# Patient Record
Sex: Female | Born: 1935 | Race: White | Hispanic: No | Marital: Married
Health system: Midwestern US, Academic
[De-identification: ages and names within clinical notes are randomized; demographics above are authoritative.]

---

## 2017-06-23 ENCOUNTER — Encounter: Admit: 2017-06-23 | Discharge: 2017-06-23 | Payer: MEDICARE

## 2017-06-23 ENCOUNTER — Inpatient Hospital Stay: Admit: 2017-06-23 | Discharge: 2017-06-23 | Payer: MEDICARE

## 2017-06-23 DIAGNOSIS — C189 Malignant neoplasm of colon, unspecified: ICD-10-CM

## 2017-06-23 DIAGNOSIS — G2581 Restless legs syndrome: Principal | ICD-10-CM

## 2017-06-23 DIAGNOSIS — A419 Sepsis, unspecified organism: Principal | ICD-10-CM

## 2017-06-23 DIAGNOSIS — H409 Unspecified glaucoma: ICD-10-CM

## 2017-06-23 DIAGNOSIS — Z8619 Personal history of other infectious and parasitic diseases: ICD-10-CM

## 2017-06-23 MED ORDER — ONDANSETRON HCL (PF) 4 MG/2 ML IJ SOLN
4 mg | INTRAVENOUS | 0 refills | Status: DC | PRN
Start: 2017-06-23 — End: 2017-06-26

## 2017-06-23 MED ORDER — VANCOMYCIN 1,250 MG IVPB
15 mg/kg | Freq: Once | INTRAVENOUS | 0 refills | Status: CP
Start: 2017-06-23 — End: ?
  Administered 2017-06-24 (×2): 1250 mg via INTRAVENOUS

## 2017-06-23 MED ORDER — VANCOMYCIN 1,250 MG IVPB
15 mg/kg | Freq: Two times a day (BID) | INTRAVENOUS | 0 refills | Status: DC
Start: 2017-06-23 — End: 2017-06-24

## 2017-06-23 MED ORDER — CEFEPIME IN DEXTROSE,ISO-OSM 2 GRAM/100 ML IV PGBK
2 g | Freq: Once | INTRAVENOUS | 0 refills | Status: CP
Start: 2017-06-23 — End: ?
  Administered 2017-06-24: 01:00:00 2 g via INTRAVENOUS

## 2017-06-23 MED ORDER — LACTATED RINGERS IV SOLP
1000 mL | Freq: Once | INTRAVENOUS | 0 refills | Status: CP
Start: 2017-06-23 — End: ?
  Administered 2017-06-24: 06:00:00 1000 mL via INTRAVENOUS

## 2017-06-23 MED ORDER — ACETAMINOPHEN 500 MG PO TAB
1000 mg | Freq: Once | ORAL | 0 refills | Status: CP
Start: 2017-06-23 — End: ?
  Administered 2017-06-24: 03:00:00 1000 mg via ORAL

## 2017-06-23 MED ORDER — HELP MEDICATION
Freq: Every evening | 0 refills | Status: DC
Start: 2017-06-23 — End: 2017-06-24

## 2017-06-23 MED ORDER — ENOXAPARIN 40 MG/0.4 ML SC SYRG
40 mg | Freq: Every day | SUBCUTANEOUS | 0 refills | Status: DC
Start: 2017-06-23 — End: 2017-06-26
  Administered 2017-06-25 – 2017-06-26 (×2): 40 mg via SUBCUTANEOUS

## 2017-06-23 MED ORDER — LACTATED RINGERS IV SOLP
30 mL/kg | INTRAVENOUS | 0 refills | Status: CP
Start: 2017-06-23 — End: ?
  Administered 2017-06-24: 01:00:00 2448 mL via INTRAVENOUS

## 2017-06-23 MED ORDER — ACETAMINOPHEN 325 MG PO TAB
650 mg | ORAL | 0 refills | Status: DC | PRN
Start: 2017-06-23 — End: 2017-06-26
  Administered 2017-06-24: 14:00:00 650 mg via ORAL

## 2017-06-23 MED ORDER — VANCOMYCIN PHARMACY TO MANAGE
1 | 0 refills | Status: DC
Start: 2017-06-23 — End: 2017-06-24

## 2017-06-23 MED ORDER — AZITHROMYCIN 250 MG PO TAB
500 mg | Freq: Every day | ORAL | 0 refills | Status: DC
Start: 2017-06-23 — End: 2017-06-24
  Administered 2017-06-24: 04:00:00 500 mg via ORAL

## 2017-06-23 MED ORDER — CEFEPIME IN DEXTROSE,ISO-OSM 2 GRAM/100 ML IV PGBK
2 g | INTRAVENOUS | 0 refills | Status: DC
Start: 2017-06-23 — End: 2017-06-24
  Administered 2017-06-24 (×2): 2 g via INTRAVENOUS

## 2017-06-24 ENCOUNTER — Encounter: Admit: 2017-06-24 | Discharge: 2017-06-24 | Payer: MEDICARE

## 2017-06-24 ENCOUNTER — Inpatient Hospital Stay: Admit: 2017-06-24 | Discharge: 2017-06-24 | Payer: MEDICARE

## 2017-06-24 LAB — LACTIC ACID (BG - RAPID LACTATE)
Lab: 3.8 MMOL/L — ABNORMAL HIGH (ref 0.5–2.0)
Lab: 2.7 MMOL/L — ABNORMAL HIGH (ref 0.5–2.0)

## 2017-06-24 LAB — BASIC METABOLIC PANEL
Lab: 103 MMOL/L — ABNORMAL HIGH (ref 98–110)
Lab: 112 mg/dL — ABNORMAL HIGH (ref 70–100)
Lab: 134 MMOL/L — ABNORMAL LOW (ref 137–147)
Lab: 22 MMOL/L (ref 21–30)
Lab: 3.8 MMOL/L — ABNORMAL HIGH (ref 3.5–5.1)
Lab: 9 pg (ref 3–12)

## 2017-06-24 LAB — RVP VIRAL PANEL PCR

## 2017-06-24 LAB — PROCALCITONIN: Lab: 0.3 ng/mL — ABNORMAL HIGH (ref <0.10)

## 2017-06-24 LAB — LACTIC ACID(LACTATE): Lab: 2 MMOL/L (ref 0.5–2.0)

## 2017-06-24 LAB — LEGIONELLA ANTIGEN URINE,RAN: Lab: NEGATIVE

## 2017-06-24 LAB — C DIFFICILE BY PCR: Lab: POSITIVE

## 2017-06-24 LAB — COMPREHENSIVE METABOLIC PANEL: Lab: 132 MMOL/L — ABNORMAL LOW (ref 137–147)

## 2017-06-24 LAB — CBC AND DIFF
Lab: 18 10*3/uL — ABNORMAL HIGH (ref 4.5–11.0)
Lab: 18 10*3/uL — ABNORMAL HIGH (ref 4.5–11.0)

## 2017-06-24 LAB — BLOOD GASES, ARTERIAL
Lab: 0.8 MMOL/L — ABNORMAL LOW (ref 25–110)
Lab: 23 MMOL/L — AB (ref 21–28)
Lab: 31 mmHg — ABNORMAL LOW (ref 35–45)
Lab: 7.4 g/dL — ABNORMAL HIGH (ref 7.35–7.45)
Lab: 73 mmHg — ABNORMAL LOW (ref 80–100)
Lab: 95 % (ref 95–99)

## 2017-06-24 LAB — POC GLUCOSE: Lab: 139 mg/dL — ABNORMAL HIGH (ref 70–100)

## 2017-06-24 LAB — POC LACTATE
Lab: 3.7 MMOL/L — ABNORMAL HIGH (ref 0.5–2.0)
Lab: 3.9 MMOL/L — ABNORMAL HIGH (ref 0.5–2.0)

## 2017-06-24 LAB — POC TROPONIN: Lab: 0 ng/mL (ref 0.00–0.05)

## 2017-06-24 LAB — PTT (APTT): Lab: 26 s (ref 20.0–36.0)

## 2017-06-24 LAB — SED RATE: Lab: 18 mm/h (ref 0–30)

## 2017-06-24 LAB — STREPTOCOCCUS PNEUMO AG, URINE

## 2017-06-24 LAB — C REACTIVE PROTEIN (CRP): Lab: 11 mg/dL — ABNORMAL HIGH (ref <1.0)

## 2017-06-24 LAB — URINALYSIS DIPSTICK: Lab: NEGATIVE U/L (ref 7–56)

## 2017-06-24 LAB — D-DIMER: Lab: 138 ng{FEU}/mL — ABNORMAL HIGH (ref <500)

## 2017-06-24 LAB — URINALYSIS, MICROSCOPIC

## 2017-06-24 LAB — CREATINE KINASE-CPK: Lab: 42 U/L (ref 21–215)

## 2017-06-24 LAB — PROTIME INR (PT): Lab: 1.2 mg/dL (ref 0.8–1.2)

## 2017-06-24 MED ORDER — VANCOMYCIN 1G/250ML D5W IVPB (VIAL2BAG)
1 g | INTRAVENOUS | 0 refills | Status: DC
Start: 2017-06-24 — End: 2017-06-24

## 2017-06-24 MED ORDER — LATANOPROST 0.005 % OP DROP
1 [drp] | Freq: Every evening | OPHTHALMIC | 0 refills | Status: DC
Start: 2017-06-24 — End: 2017-06-26
  Administered 2017-06-25: 02:00:00 1 [drp] via OPHTHALMIC

## 2017-06-24 MED ORDER — VANCOMYCIN PHARMACY TO MANAGE
1 | 0 refills | Status: DC
Start: 2017-06-24 — End: 2017-06-24

## 2017-06-24 MED ORDER — LACTOBACILLUS RHAMNOSUS GG 15 BILLION CELL PO CPSP
1 | Freq: Two times a day (BID) | ORAL | 0 refills | Status: DC
Start: 2017-06-24 — End: 2017-06-26
  Administered 2017-06-24 – 2017-06-26 (×5): 1 via ORAL

## 2017-06-24 MED ORDER — PERFLUTREN LIPID MICROSPHERES 1.1 MG/ML IV SUSP
1-20 mL | Freq: Once | INTRAVENOUS | 0 refills | Status: CP
Start: 2017-06-24 — End: ?
  Administered 2017-06-24: 16:00:00 2 mL via INTRAVENOUS

## 2017-06-24 MED ORDER — VANCOMYCIN 25 MG/ML PO SOLR
250 mg | Freq: Four times a day (QID) | ORAL | 0 refills | Status: DC
Start: 2017-06-24 — End: 2017-06-26
  Administered 2017-06-24 – 2017-06-26 (×9): 250 mg via ORAL

## 2017-06-24 MED ORDER — CEFEPIME IN DEXTROSE,ISO-OSM 2 GRAM/100 ML IV PGBK
2 g | Freq: Two times a day (BID) | INTRAVENOUS | 0 refills | Status: DC
Start: 2017-06-24 — End: 2017-06-24

## 2017-06-24 NOTE — Progress Notes
NICOM Fluid Bolus 58mL  Fluid Bolus Challenge: yes  Fluid Volume infused: 526ml Time started: 1941   CO CI HR NIBP MAP TPR TPRI SV SVI SVV   Baseline  4.2 120      36    Challenge  4.9 117      44    Stroke Volume Index Change: 22.8%

## 2017-06-24 NOTE — Care Coordination-Inpatient
Please page [phone] with any issues until 8am after that please page med private N

## 2017-06-24 NOTE — ED Notes
[place] (ready) please call Raquel Sarna at [phone]

## 2017-06-24 NOTE — Other
Critical result or procedure called (document test and value, and read back):  Lactate 3.86  Time MD/NP Notified:  [email or web address]  MD/NP Name:  Dr. Sheran Spine, Dr. Hampton Abbot   MD/NP Response/Orders Given:  Activating sepsis response team

## 2017-06-24 NOTE — Progress Notes
RESPIRATORY THERAPY  ADULT PROTOCOL EVALUATION      RESPIRATORY PROTOCOL PLAN    Medications       Note: If indicated by protocol, medication orders will be placed by therapist.    Procedures  Oxygen/Humidity: O2 to keep SpO2 > 92%  Monitoring: Pulse oximetry BID & PRN       _____________________________________________________________    PATIENT EVALUATION RESULTS    Chart Review  * Pulmonary Hx: No pulmonary diagnosis OR no smoking hx    * Surgical Hx: No surgery OR last surgery > 6 weeks ago OR trach/stoma (BA)    * Chest X-Ray: Clear OR not available (CXR Still in Process)    * PFT/Oxygenation: FEV1, PEFR < 70% OR Pa02 < 70 RA OR Sp02 <92% RA OR Fi02 > 0.21 to keep Sp02 > 92% OR < 24 hours post-op (02 & oxim) OR chronic C02 retention (C02) (94% on RA)      Patient Assessment  * Respiratory Pattern: Regular pattern and rate OR good chest excursion with deep breathing    * Breath Sounds: Clear and able to auscultate bases posteriorly    * Cough / Sputum: Good effective cough OR occasional or minimal sputum OR thin sputum    * Mental Status: Alert, oriented, cooperative    * Activity Level: Ambulatory with assistance      Priority Index  Total Points: 4 Points  * Priority Index: 1    PRIORITY INDEX GUIDELINES*  Priority Points   1 0-9 points   2 9-18 points   3 > 18 points   + Pulm Dx or Home Rx   *Higher points indicate higher acuity.      Therapist: Jasper Loser, RT  Date: 06/24/2017      Key  AC = Airway clearance  AM = Aerosolized medication  BA = Bland aerosol  DB&C = Deep breathe & cough  FEV1 = Forced expiratory volume in first second)  IC = Inspiratory capacity  LE = Lung expansion  MDI = Metered dose inhaler  Neb = Nebulizer  O2 = Oxygen  Oxim =Oximetry  PEFR = Peak expiratory flow rate  RRT = Rapid Response Team

## 2017-06-24 NOTE — Progress Notes
NICOM Fluid Bolus   Fluid Bolus Challenge:yes  Fluid Volume infused: 568ml Time started: 2025   CO CI HR NIBP MAP TPR TPRI SV SVI SVV   Baseline  4.2       35    Challenge  4.5       39    Stroke Volume Index Change: 10.1%

## 2017-06-24 NOTE — Progress Notes
Patient arrived to room 6420 via cart accompanied by tech. Patient transferred to the bed with assistance. Bedside safety checks completed. Initial patient assessment completed, refer to flowsheet for details. Admission skin assessment completed by: Bonney Roussel, RN & Joycelyn Schmid, RN    Pressure Injury Present on Hospital Admission (within 24 hours): No    1. Occiput: No  2. Ear: No  3. Scapula: No  4. Spinous Process: No  5. Shoulder: No  6. Elbow: No  7. Iliac Crest: No  8. Sacrum/Coccyx: No  9. Ischial Tuberosity: No  10. Trochanter: No  11. Knee: No  12. Malleolus: No  13. Heel: No  14. Toes: No  15. Assessed for device associated injury Yes  16. Nursing Nutrition Assessment Completed Yes

## 2017-06-24 NOTE — Progress Notes
NICOM Fluid Bolus   Fluid Bolus Challenge: yes  Fluid Volume infused: 575ml Time started: 2010   CO CI HR NIBP MAP TPR TPRI SV SVI SVV   Baseline  4.3       38    Challenge  5.1       43    Stroke Volume Index Change: 14.3%

## 2017-06-24 NOTE — ED Notes
Sepsis response team activated at this time

## 2017-06-25 LAB — CBC AND DIFF: Lab: 13 K/UL — ABNORMAL HIGH (ref 4.5–11.0)

## 2017-06-25 LAB — BASIC METABOLIC PANEL
Lab: 129 MMOL/L — ABNORMAL LOW (ref 137–147)
Lab: 21 MMOL/L — ABNORMAL LOW (ref 21–30)
Lab: 3.6 MMOL/L — ABNORMAL HIGH (ref 3.5–5.1)

## 2017-06-25 NOTE — Progress Notes
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  Additionally, mild endometrial thickening  -3 prior episodes of CDI from February to March 2018 after exposure to IV antibiotic, believed that all episodes treated with PO vancomycin, 3rd episode with prolonged taper.  Never required hospitalization      Recent uterine polyp removal  -Procedure on 9/5, reports some uterine bleeding afterwards but no foul odor, no further discharge, no pelvic pain    L shoulder arthroplasty, complicated by post-op hematoma and infection  -Arthoplasty in 09/2016, developed hematoma in 10/2016 and went back to the OR for evacuation  -Unclear, but per son received 6 weeks of IV antibiotics via PICC line after the hematoma evacuation, unknown abx but received Q24H, managed by Dr. Tawanna Cooler    Central sleep apnea  RLS  -On ropinirole PTA      Recommendations:     C diff positive, other abx stopped   when diarrhea is better, then can switch to po vancomycin 125 QID for 14 days, then recommend po vanc 125 mg daily for chronic suppression/prevention vue the age o patient and the recurrent c diff    History of Present Illness    NELIAH Diaz is a 83 y.o. female     Diarrhea is slightly better   no more fever     no N/V no abd pain   no cough no sob   feels ok otherwise     labs     wbc better at 13  c diff positive Antimicrobial Start date End date   Vancomycin 9/7 9/8   Cefepime 9/7 9/8   Azithromycin 9/7 9/8   PO vancomycin 9/8 active                  CrCl cannot be calculated (Unknown ideal weight.).      Medications   Scheduled Meds:    enoxaparin (LOVENOX) syringe 40 mg 40 mg Subcutaneous QDAY(21)   lactobacillus rhamnosus GG (CULTURELLE) 15 billion cell capsule 1 capsule 1 capsule Oral BID w/meals   latanoprost (XALATAN) 0.005 % ophthalmic solution 1 drop 1 drop Right Eye QHS   vancomycin (FIRVANQ) oral solution 250 mg 250 mg Oral QID   Continuous Infusions:  PRN and Respiratory Meds:acetaminophen Q6H PRN, ondansetron (ZOFRAN) IV Q6H PRN      Physical Examination                          Vital Signs: Last                  Vital Signs: 24 Hour Range   BP: 117/66 (09/09 0843)  Temp: 36.9 ???C (98.4 ???F) (09/09 4540)  Pulse: 69 (09/09 0843)  Respirations: 17 PER MINUTE (09/09 0843)  SpO2: 94 % (09/09 0843)  O2 Delivery: None (Room Air) (  09/09 0843)  Height: 147.3 cm (58) (09/08 1102) BP: (117-150)/(52-74)   Temp:  [36.9 ???C (98.4 ???F)-37.7 ???C (99.9 ???F)]   Pulse:  [69-115]   Respirations:  [17 PER MINUTE-38 PER MINUTE]   SpO2:  [93 %-99 %]   O2 Delivery: None (Room Air)     General appearance: oriented, elderly, NAD  HENT: mucus membranes moist, no oral lesions/thrush  Lungs: CTAB, no wheezing, rhonchi, or rales appreciated  Heart: Tachycardic, regular rhythm, no murmur  Abdomen: soft, non-distended, non-tender, hyperactive bowel sounds, no HSM.  Prior LLQ surgical scar, well healed  Ext:  No clubbing, cyanosis, 1+ bilateral LE edema.  L shoulder well-healed surgical scar, no joint effusion or tenderness  Skin: no rashes/lesions        Lab Review   Hematology  Recent Labs      06/23/17   1909  06/24/17   0010  06/24/17   0404  06/25/17   0553   WBC  18.1*   --   18.6*  13.7*   HGB  16.0*   --   15.4*  14.8   HCT  49.0*   --   46.5*  43.5   PLTCT  293   --   233  209   PTT   --   26.3   --    --    INR   --   1.2   --    -- Chemistry  Recent Labs      06/23/17   1909  06/24/17   0404  06/25/17   0553   NA  132*  134*  129*   K  4.0  3.8  3.6   CL  98  103  101   CO2  22  22  21    BUN  19  17  15    CR  0.93  0.83  0.84   GFR  57*  >60  >60   GLU  157*  112*  117*   CA  9.7  8.7  8.4*   ALBUMIN  4.5   --    --    ALKPHOS  58   --    --    AST  24   --    --    ALT  16   --    --    TOTBILI  0.6   --    --        Microbiology, Radiology and other Diagnostics Review   Microbiology data reviewed.  9/7 BC x2 - NGTD  9/7 Urine Strep pneumo and Legionella Ag - negative    Pertinent radiology images viewed.  9/7 CT chest:  1. Mild bibasilar atelectasis and scarring, without focal pneumonia.  2. Multivessel coronary artery calcifications.  3. Suspected mild gallbladder sludge with no evidence of acute   Cholecystitis.    9/8 CT abd/pelvis:  1. Mild pericolonic stranding involving the ascending, transverse, and   descending colon (greatest around the ascending colon) compatible with   colitis, likely on an infectious or inflammatory basis.  2. No ascites, bowel obstruction, or free air.  3. Suggestion of mild endometrial thickening, incompletely evaluated on   this exam. Correlation with nonemergent pelvic ultrasound recommended for   further characterization.

## 2017-06-25 NOTE — Progress Notes
PHYSICAL THERAPY  ASSESSMENT/DISCHARGE NOTE     MOBILITY:  Mobility  Progressive Mobility Level: Walk laps  Distance Walked (feet): 300 ft  Level of Assistance: Stand by assistance  Assistive Device: Walker  Time Tolerated: 11-30 minutes  Activity Limited By: Shortness of air    SUBJECTIVE:  Subjective  Significant hospital events: PMHx of RLS, recent L shoulder replacement comp w/ post-operative infection and c difficile, colon cancer admitted for severe sepsis   Mental / Cognitive Status: Alert;Oriented;Cooperative;Follows Commands  Pain: Patient has no complaint of pain  Ambulation Assist: Independent Mobility in [employer] with Device  Patient Owned Equipment: Single [employer] Situation: Lives with Family (Spouse who is currenlty in SICU)  Type of Home: House  Entry Stairs: No Stairs  In-Home Stairs: 6-10 Stairs (To basement; (doesn't need to go down to))    ROM:  ROM  ROM Method: Active  LE ROM: Bilateral;Hip;Knee;Ankle;WFL    STRENGTH:  Strength  Overall Strength: WFL;No Focal Deficits Noted    BED MOBILITY/TRANSFERS:  Bed Mobility/Transfers  Transfer Type: Sit to Stand  Transfer: Assistance Level: From;Bed Side Chair;Standby Assist  Transfer: Assistive Device: Nurse, adult  Transfers: Type Of Assistance: Verbal Cues  End Of Activity Status: Up in Chair;Nursing Notified;Instructed Patient to Request Assist with Mobility;Instructed Patient to Use Call Light    BALANCE:  Balance  Standing Balance: Static Standing Balance;Dynamic Standing Balance;No UE support;Standby Assist    GAIT:  Gait  Gait Distance: 300 feet  Gait: Assistance Level: Standby Assist  Gait: Assistive Device:  (150' with RW; 150' without)  Gait: Descriptors: Pace: Normal;Swing-Through Gait;No balance loss  Activity Limited By: SOA    EDUCATION:  Education  Persons Educated: Patient  Patient Barriers To Learning: None Noted  Teaching Methods: Verbal Instruction  Patient Response: Verbalized Understanding;Return Demonstration Topics: Plan/Goals of PT Interventions;Importance of Increasing Activity;Ambulate With Nursing;Up with Assist Only    ASSESSMENT/PROGRESS:  Assessment/Progress  Assessment/Progress: Discontinue PT;Patient current status and level of safety suggests progression of activity can be achieved with nursing and/or family and does not require physical therapist intervention    AM-PAC 6 Clicks Basic Mobility Inpatient  Turning from your back to your side while in a flat bed without using bed rails: None  Moving from lying on your back to sitting on the side of a flatbed without using bedrails : None  Moving to and from a bed to a chair (including a wheelchair): None  Standing up from a chair using your arms (e.g. wheelchair, or bedside chair): None  To walk in hospital room: None  Climbing 3-5 steps with a railing: A Little  Raw Score: 23  Standardized (T-scale) Score: 50.88  Basic Mobility CMS 0-100%: 16.55  CMS G Code Modifier for Basic Mobility: CI     G-Codes: Mobility  [id number] Current Status: 1-19% Impairment   G8979 Goal Status:1-19% Impairment    G8980 Discharge Status: 1-19% Impairment      Based on above evaluation and clinical judgment.    PLAN:  Plan   Plan Frequency: No Further Treatment    RECOMMENDATIONS:  PT Discharge Recommendations  PT Discharge Recommendations: Home with Assistance  Equipment Recommendations: None;Patient owns necessary equipment    Therapist: Ernestine Mcmurray, PT  Date: 06/25/2017

## 2017-06-26 ENCOUNTER — Encounter: Admit: 2017-06-26 | Discharge: 2017-06-26 | Payer: MEDICARE

## 2017-06-26 ENCOUNTER — Inpatient Hospital Stay: Admit: 2017-06-23 | Discharge: 2017-06-26 | Disposition: A | Discharge status: Home / Self Care | Payer: MEDICARE

## 2017-06-26 ENCOUNTER — Emergency Department: Admit: 2017-06-23 | Discharge: 2017-06-23 | Payer: MEDICARE

## 2017-06-26 DIAGNOSIS — G4733 Obstructive sleep apnea (adult) (pediatric): ICD-10-CM

## 2017-06-26 DIAGNOSIS — Z9081 Acquired absence of spleen: ICD-10-CM

## 2017-06-26 DIAGNOSIS — Z96612 Presence of left artificial shoulder joint: ICD-10-CM

## 2017-06-26 DIAGNOSIS — R6520 Severe sepsis without septic shock: ICD-10-CM

## 2017-06-26 DIAGNOSIS — G2581 Restless legs syndrome: ICD-10-CM

## 2017-06-26 DIAGNOSIS — H409 Unspecified glaucoma: ICD-10-CM

## 2017-06-26 DIAGNOSIS — A0472 Enterocolitis due to Clostridium difficile, not specified as recurrent: ICD-10-CM

## 2017-06-26 DIAGNOSIS — E872 Acidosis: ICD-10-CM

## 2017-06-26 DIAGNOSIS — Z85038 Personal history of other malignant neoplasm of large intestine: ICD-10-CM

## 2017-06-26 DIAGNOSIS — A419 Sepsis, unspecified organism: Principal | ICD-10-CM

## 2017-06-26 LAB — BASIC METABOLIC PANEL: Lab: 135 MMOL/L — ABNORMAL LOW (ref >60)

## 2017-06-26 LAB — CBC AND DIFF: Lab: 7.5 10*3/uL — ABNORMAL HIGH (ref >60)

## 2017-06-26 MED ORDER — VANCOMYCIN 25 MG/ML PO SOLR
125 mg | Freq: Four times a day (QID) | ORAL | 0 refills | Status: DC
Start: 2017-06-26 — End: 2017-06-26
  Administered 2017-06-26: 19:00:00 125 mg via ORAL

## 2017-06-26 MED ORDER — VANCOMYCIN 25 MG/ML PO SOLR
Freq: Two times a day (BID) | 0 refills | Status: SS
Start: 2017-06-26 — End: 2017-08-23

## 2017-06-26 MED ORDER — LACTOBACILLUS RHAMNOSUS GG 15 BILLION CELL PO CPSP
1 | ORAL_CAPSULE | Freq: Two times a day (BID) | ORAL | 3 refills | Status: SS
Start: 2017-06-26 — End: 2017-08-23

## 2017-06-26 MED ORDER — VANCOMYCIN 125 MG PO CAP
ORAL_CAPSULE | Freq: Four times a day (QID) | ORAL | 11 refills | 10.00000 days | Status: AC
Start: 2017-06-26 — End: 2017-06-26

## 2017-06-26 MED ORDER — POTASSIUM CHLORIDE 20 MEQ PO TBTQ
40 meq | Freq: Once | ORAL | 0 refills | Status: CP
Start: 2017-06-26 — End: ?
  Administered 2017-06-26: 16:00:00 40 meq via ORAL

## 2017-06-26 NOTE — Case Management (ED)
Case Management Admission Assessment    NAME:Tara Diaz                          MRN: [id number]             DOB:1930/10/03          AGE: 83 y.o.  ADMISSION DATE: 06/23/2017             DAYS ADMITTED: LOS: 3 days      Today???s Date: 06/26/2017    Source of Information: Patient and Son       Plan: Home today  Plan: Discharge Planning for Home Anticipated   ??? Pt needs vancomycin oral solution 125 qid for a total of 14 days (until 07/05/17) then twce daily x 1 week then daily for 1 week, then every other day x 4 weeks.    ??? Pt does not have any prescription insurance.  ??? Pt's son will drive her home.    Interventions  ? Support      ? Info or Referral      ? Discharge Planning      ? Medication Needs   Medication Needs: Co-Pay Check (for oral vancomycin)   ??? NCM called a script to pt[facility] home [facility].  Vancomycin capsules are $621.32/month.  ??? NCM spoke with patient and son at bedside.  Pt has been on vancomycin oral solution in the past and it was cheaper than the capsules. [facility] will have to order it in.  Pt's son was requesting a taper on the medication, which was approved by ID. Team Pharmacist, Shanda Bumps, [facility].  [facility] to take until the oral solution arrives tomorrow.    ? Financial      ? Legal      ? Other      Disposition  ? Expected Discharge Date    Expected Discharge Date: 06/26/17  ? Transportation   Does the patient need discharge transport arranged?: No  Transportation Name, Phone and Availability #1: son at bedside and will drive her home today  Does the patient use Medicaid Transportation?: No  ? Next Level of Care (Acute Psych discharges only)      ? Discharge Disposition      Durable Medical Equipment     No service has been selected for the patient.      [place] Destination     No service has been selected for the patient.      [facility]     No service has been selected for the patient.      [facility] No service has been selected for the patient.        Patient Address/Phone  [address]  [place] [address]  [phone] (home)     Emergency Contact  Extended Emergency Contact Information  Primary Emergency Contact: Cai, [place]  Home Phone: [phone]  Mobile Phone: [phone]  Relation: Son    Healthcare Directive  No    Transportation  Does the patient need discharge transport arranged?: No  Transportation Name, Phone and Availability #1: son at bedside and will drive her home today  Does the patient use Medicaid Transportation?: No    Expected Discharge Date  Expected Discharge Date: 06/26/17    Living Situation Prior to Admission  ? Living Arrangements  Type of Residence: Home, independent  Living Arrangements: Spouse/significant  other  ? Level of Function   Prior level of function: Independent (drives, is not homebound)  ? Cognitive Abilities   Cognitive Abilities: Alert and Oriented, Engages in problem solving and planning, Understands nature of health condition, Participates in decision making    Financial Resources  ? Coverage  Primary Insurance: Medicare Replacement [employer])  Secondary Insurance: No insurance  Additional Coverage:  (No prescription insurance.)    ? Source of Income   Source Of Income: SSI, Other retirement income  ? Financial Assistance Needed?  No  Psychosocial Needs  ? Mental Health  Mental Health History: No  ? Substance Use History  Substance Use History Screen: No  ? Other  no  Current/Previous Services  ? PCP  Nicole Cella, [phone], [phone]  ? Pharmacy    [phone] [place] [place] [facility] [address]  [address] [place]  [place] [address]  Phone: [phone] Fax: [phone]    [facility])  [phone] [address]  [place] [address]  Phone: [phone] Fax: [phone]    ? Durable Medical Equipment   Durable Medical Equipment at home: None  ? Home Health Receiving home health: In the past  Agency name: [facility]  Would patient use this agency again?: Yes  ? Hemodialysis or Peritoneal Dialysis  Undergoing hemodialysis or peritoneal dialysis: No  ? Tube/Enteral Feeds  Receive tube/enteral feeds: No  ? Infusion  Receive infusions: In the past  Where: Home  Infusion company: Unknown  ? Private Duty  Private duty help used: No  ? Home and Community Based [employer] and community based services: No  ? [facility]: N/A  ? Hospice  Hospice: No  ? Outpatient Therapy  PT: Yes  When did patient receive care?: after shoulder surgery  ? Skilled Nursing Facility/Nursing Home  SNF: No  NH: No  ? Inpatient Rehab  IPR: No  ? Long-Term Acute Care Hospital  LTACH: No  ? Acute Hospital Stay  Acute Hospital Stay: No    Sanjuan Dame, RN, ACM  Integrated Nurse Case Manager  [phone]  Pgr [phone]

## 2017-06-26 NOTE — Progress Notes
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  Additionally, mild endometrial thickening  -3 prior episodes of CDI from February to March 2018 after exposure to IV antibiotic, believed that all episodes treated with PO vancomycin, 3rd episode with prolonged taper.  Never required hospitalization      Recent uterine polyp removal  -Procedure on 9/5, reports some uterine bleeding afterwards but no foul odor, no further discharge, no pelvic pain    L shoulder arthroplasty, complicated by post-op hematoma and infection  -Arthoplasty in 09/2016, developed hematoma in 10/2016 and went back to the OR for evacuation  -Unclear, but per son received 6 weeks of IV antibiotics via PICC line after the hematoma evacuation, unknown abx but received Q24H, managed by Dr. Tawanna Cooler    Central sleep apnea  RLS  -On ropinirole PTA      Recommendations:     C diff positive, other abx stopped   Can switch to po vancomycin 125 QID for 14 days, then recommend po vanc 125 mg daily for chronic suppression/prevention vue the age  and the recurrent c diff    History of Present Illness    Tara Diaz is a 83 y.o. female     Diarrhea is better ++, only small BM last night   no  fever     no N/V no abd pain   no cough no sob   feels ok otherwise     labs     wbc normal  c diff positive          Antimicrobial Start date End date Vancomycin 9/7 9/8   Cefepime 9/7 9/8   Azithromycin 9/7 9/8   PO vancomycin 9/8 active                  CrCl cannot be calculated (Unknown ideal weight.).      Medications   Scheduled Meds:    enoxaparin (LOVENOX) syringe 40 mg 40 mg Subcutaneous QDAY(21)   lactobacillus rhamnosus GG (CULTURELLE) 15 billion cell capsule 1 capsule 1 capsule Oral BID w/meals   latanoprost (XALATAN) 0.005 % ophthalmic solution 1 drop 1 drop Right Eye QHS   vancomycin (FIRVANQ) oral solution 250 mg 250 mg Oral QID   Continuous Infusions:  PRN and Respiratory Meds:acetaminophen Q6H PRN, ondansetron (ZOFRAN) IV Q6H PRN      Physical Examination                          Vital Signs: Last                  Vital Signs: 24 Hour Range   BP: 110/68 (09/10 0340)  Temp: 36.8 ???C (98.2 ???F) (09/10 0340)  Pulse: 76 (09/10 0508)  Respirations: 18 PER MINUTE (09/10 0340)  SpO2: 94 % (09/10 0508)  O2  Delivery: None (Room Air) (09/10 0340) BP: (110-126)/(57-74)   Temp:  [36.8 ???C (98.2 ???F)-37 ???C (98.6 ???F)]   Pulse:  [69-100]   Respirations:  [16 PER MINUTE-18 PER MINUTE]   SpO2:  [92 %-96 %]   O2 Delivery: None (Room Air)     General appearance: oriented, elderly, NAD  HENT: mucus membranes moist, no oral lesions/thrush  Lungs: CTAB, no wheezing, rhonchi, or rales appreciated  Heart: Tachycardic, regular rhythm, no murmur  Abdomen: soft, non-distended, non-tender, hyperactive bowel sounds, no HSM.  Prior LLQ surgical scar, well healed  Ext:  No clubbing, cyanosis, 1+ bilateral LE edema.  L shoulder well-healed surgical scar, no joint effusion or tenderness  Skin: no rashes/lesions    No change on exam today        Lab Review   Hematology  Recent Labs      06/24/17   0010  06/24/17   0404  06/25/17   0553  06/26/17   0448   WBC   --   18.6*  13.7*  7.5   HGB   --   15.4*  14.8  13.9   HCT   --   46.5*  43.5  41.2   PLTCT   --   233  209  215   PTT  26.3   --    --    --    INR  1.2   --    --    --      Chemistry  Recent Labs      06/23/17 1909  06/24/17   0404  06/25/17   0553  06/26/17   0448   NA  132*  134*  129*  135*   K  4.0  3.8  3.6  3.4*   CL  98  103  101  104   CO2  22  22  21  24    BUN  19  17  15  16    CR  0.93  0.83  0.84  0.75   GFR  57*  >60  >60  >60   GLU  157*  112*  117*  101*   CA  9.7  8.7  8.4*  8.6   ALBUMIN  4.5   --    --    --    ALKPHOS  58   --    --    --    AST  24   --    --    --    ALT  16   --    --    --    TOTBILI  0.6   --    --    --        Microbiology, Radiology and other Diagnostics Review   Microbiology data reviewed.  9/7 BC x2 - NGTD  9/7 Urine Strep pneumo and Legionella Ag - negative    Pertinent radiology images viewed.  9/7 CT chest:  1. Mild bibasilar atelectasis and scarring, without focal pneumonia.  2. Multivessel coronary artery calcifications.  3. Suspected mild gallbladder sludge with no evidence of acute   Cholecystitis.    9/8 CT abd/pelvis:  1. Mild pericolonic stranding involving the ascending, transverse, and   descending colon (greatest around the ascending colon) compatible with   colitis, likely on an infectious or inflammatory basis.  2. No ascites, bowel obstruction, or free air.  3. Suggestion of mild endometrial thickening, incompletely evaluated on  this exam. Correlation with nonemergent pelvic ultrasound recommended for   further characterization.

## 2017-06-26 NOTE — Progress Notes
General Progress Note    Name:  Tara Diaz   Today's Date:  06/26/2017  Admission Date: 06/23/2017  LOS: 3 days                     Assessment/Plan:    Principal Problem:    Severe sepsis (HCC)  Active Problems:    Lactic acidosis    Leukocytosis    Lethargy    83 y/o F with PMHx of RLS, recent L shoulder replacement comp w/ post-operative infection and c difficile, colon cancer admitted for severe sepsis   ???  ???  Severe Sepsis with lactic acidosis, secondary to C diff infection, diarrhea  -Unclear source of sepsis initially, was started on empiric vancomycin, cefepime, azithromycin.  Stopped now  -Stool positive for C. Difficile  -Started p.o. vancomycin, will continue with plan for taper on discharge   - patient has been visiting her husband in SICU, who had surgery for colon tumor resection, complicated with post-op bleeding and pneumonia, currently intubated and cultures that have been positive for serratia.   -CT chest negative for acute pneumonia or infectious process   -blood cultures neg   -Urine legionella and urine strep pneumonia and RVP neg  -Lactate 3.9 on initial presentation, level normal on follow-up  -Procalcitonin 0.35   -LR @ 100 cc/hr , 1000L   -CK normal, ESR normal, CRP 11.3   -CT abd/pelvis ; Mild pericolonic stranding involving the ascending, transverse, and descending colon (greatest around the ascending colon) compatible with colitis.  -Echo: Mild concentric LVH. Mild LV diastolic dysfunction. Hyperdynamic LV systolic function, EF 70%  -ID consulted  -Will follow with her outpatient ID physician on discharge        Mild endometrial thickening   -Seen on CT abdomen  -Follows with Gyn, she had a uterine polyp removed in [place], [place] 3 days pta, will follow with Gyn as outpatient     Glaucoma - PTA Bimatoprost right eye one drop at night   ???  RLS - restarted PTA requip  ???  Diet: Regular   DVT ppx:  Lovenox   Dispo: d/c home today   Code: Full Code Time spent: I spent 35 minutes on coordinating discharge today  ________________________________________________________________________    Subjective  Tara Diaz is a 83 y.o. female.  Patient reports feeling better. No chest pain, dyspnea. Stools more formed today. No dizziness  Feels ready to go home today     ROS: no N/V, no fevers       Medications  Scheduled Meds:    enoxaparin (LOVENOX) syringe 40 mg 40 mg Subcutaneous QDAY(21)   lactobacillus rhamnosus GG (CULTURELLE) 15 billion cell capsule 1 capsule 1 capsule Oral BID w/meals   latanoprost (XALATAN) 0.005 % ophthalmic solution 1 drop 1 drop Right Eye QHS   vancomycin (FIRVANQ) oral solution 250 mg 250 mg Oral QID   Continuous Infusions:  PRN and Respiratory Meds:acetaminophen Q6H PRN, ondansetron (ZOFRAN) IV Q6H PRN        Objective:                          Vital Signs: Last Filed                 Vital Signs: 24 Hour Range   BP: 138/57 (09/10 0838)  Temp: 37.1 ???C (98.7 ???F) (09/10 1610)  Pulse: 92 (09/10 0838)  Respirations: 18 PER MINUTE (09/10 0838)  SpO2: 95 % (  09/10 0838)  O2 Delivery: None (Room Air) (09/10 0838) BP: (110-138)/(57-74)   Temp:  [36.8 ???C (98.2 ???F)-37.1 ???C (98.7 ???F)]   Pulse:  [76-100]   Respirations:  [16 PER MINUTE-18 PER MINUTE]   SpO2:  [92 %-96 %]   O2 Delivery: None (Room Air)     Vitals:    06/23/17 1819 06/23/17 2357 06/24/17 1102   Weight: 81.6 kg (180 lb) 80.9 kg (178 lb 5.6 oz) 81 kg (178 lb 9.2 oz)       Intake/Output Summary:  (Last 24 hours)    Intake/Output Summary (Last 24 hours) at 06/26/17 1002  Last data filed at 06/26/17 0838   Gross per 24 hour   Intake              850 ml   Output              900 ml   Net              -50 ml      Stool Occurrence: 1    Physical Exam  GEN:no acute distress, AA  CARDIAC: RRR, no murmur   RESP: CTA  ABD: soft nontender, non distended, normoactive bowel sounds   INT: no rashes   EXT: no edema   NEURO: alert and oriented x3, no focal deficits grossly       Lab Review 24-hour labs:    Results for orders placed or performed during the hospital encounter of 06/23/17 (from the past 24 hour(s))   CBC AND DIFF    Collection Time: 06/26/17  4:48 AM   Result Value Ref Range    White Blood Cells 7.5 4.5 - 11.0 K/UL    RBC 4.40 4.0 - 5.0 M/UL    Hemoglobin 13.9 12.0 - 15.0 GM/DL    Hematocrit 16.1 36 - 45 %    MCV 93.7 80 - 100 FL    MCH 31.7 26 - 34 PG    MCHC 33.8 32.0 - 36.0 G/DL    RDW 09.6 11 - 15 %    Platelet Count 215 150 - 400 K/UL    MPV 8.1 7 - 11 FL    Neutrophils 77 41 - 77 %    Lymphocytes 14 (L) 24 - 44 %    Monocytes 6 4 - 12 %    Eosinophils 3 0 - 5 %    Basophils 0 0 - 2 %    Absolute Neutrophil Count 5.80 1.8 - 7.0 K/UL    Absolute Lymph Count 1.10 1.0 - 4.8 K/UL    Absolute Monocyte Count 0.40 0 - 0.80 K/UL    Absolute Eosinophil Count 0.20 0 - 0.45 K/UL    Absolute Basophil Count 0.00 0 - 0.20 K/UL   BASIC METABOLIC PANEL    Collection Time: 06/26/17  4:48 AM   Result Value Ref Range    Sodium 135 (L) 137 - 147 MMOL/L    Potassium 3.4 (L) 3.5 - 5.1 MMOL/L    Chloride 104 98 - 110 MMOL/L    CO2 24 21 - 30 MMOL/L    Anion Gap 7 3 - 12    Glucose 101 (H) 70 - 100 MG/DL    Blood Urea Nitrogen 16 7 - 25 MG/DL    Creatinine 0.45 0.4 - 1.00 MG/DL    Calcium 8.6 8.5 - 40.9 MG/DL    eGFR Non African American >60 >60 mL/min    eGFR African American >60 >60 mL/min  Point of Care Testing  (Last 24 hours)  Glucose: (!) 101 (06/26/17 0448)    Radiology and other Diagnostics Review:    Pertinent radiology reviewed.    Gladis Riffle, MD   Med Private N Team pager [phone]

## 2017-06-26 NOTE — Progress Notes
General Progress Note    Name:  Tara Diaz   Today's Date:  06/25/2017  Admission Date: 06/23/2017  LOS: 2 days                     Assessment/Plan:    Principal Problem:    Severe sepsis (HCC)  Active Problems:    Lactic acidosis    Leukocytosis    Lethargy    83 y/o F with PMHx of RLS, recent L shoulder replacement comp w/ post-operative infection and c difficile, colon cancer admitted for severe sepsis   ???  ???  Severe Sepsis with lactic acidosis, secondary to C diff infection, diarrhea  -Unclear source of sepsis initially, was started on empiric vancomycin, cefepime, azithromycin.  Stopped now  -Stool positive for C. Difficile  -Started p.o. vancomycin, will continue  - patient has been visiting her husband in SICU, who had surgery for colon tumor resection, complicated with post-op bleeding and pneumonia, currently intubated and cultures that have been positive for serratia.   -CT chest negative for acute pneumonia or infectious process   -Follow blood cultures  -Urine legionella and urine strep pneumonia and RVP neg  -Lactate 3.9 on initial presentation, level normal on follow-up  -Procalcitonin 0.35   -follow Sputum cultures   -LR @ 100 cc/hr , 1000L   -CK normal, ESR normal, CRP 11.3   -CT abd/pelvis ; Mild pericolonic stranding involving the ascending, transverse, and descending colon (greatest around the ascending colon) compatible with colitis.  -Echo: Mild concentric LVH. Mild LV diastolic dysfunction. Hyperdynamic LV systolic function, EF 70%  -ID consulted       Mild endometrial thickening   -Seen on CT abdomen  -Follows with Gyn, she had a uterine polyp removed in [place], [place] 3 days ago, will follow with Gyn as outpatient     Glaucoma - PTA Bimatoprost right eye one drop at night   ???  RLS - hold PTA requip for now (on 1 mg at night - per son)  ???  Diet: Regular   DVT ppx:  Lovenox   Dispo: continue as inpatient, possible discharge tomorrow if diarrhea improving   Code: Full Code ________________________________________________________________________    Subjective  Tara Diaz is a 83 y.o. female.  Patient reports feeling better. No chest pain, dyspnea. Has diarrhea. No dizziness    ROS: no N/V, no fevers       Medications  Scheduled Meds:    enoxaparin (LOVENOX) syringe 40 mg 40 mg Subcutaneous QDAY(21)   lactobacillus rhamnosus GG (CULTURELLE) 15 billion cell capsule 1 capsule 1 capsule Oral BID w/meals   latanoprost (XALATAN) 0.005 % ophthalmic solution 1 drop 1 drop Right Eye QHS   vancomycin (FIRVANQ) oral solution 250 mg 250 mg Oral QID   Continuous Infusions:  PRN and Respiratory Meds:acetaminophen Q6H PRN, ondansetron (ZOFRAN) IV Q6H PRN        Objective:                          Vital Signs: Last Filed                 Vital Signs: 24 Hour Range   BP: 124/74 (09/09 1746)  Temp: 37 ???C (98.6 ???F) (09/09 1746)  Pulse: 99 (09/09 1746)  Respirations: 16 PER MINUTE (09/09 1746)  SpO2: 96 % (09/09 1746)  O2 Delivery: None (Room Air) (09/09 1746) BP: (117-150)/(63-74)   Temp:  [36.9 ???C (  98.4 ???F)-37.6 ???C (99.6 ???F)]   Pulse:  [69-114]   Respirations:  [16 PER MINUTE-30 PER MINUTE]   SpO2:  [92 %-99 %]   O2 Delivery: None (Room Air)     Vitals:    06/23/17 1819 06/23/17 2357 06/24/17 1102   Weight: 81.6 kg (180 lb) 80.9 kg (178 lb 5.6 oz) 81 kg (178 lb 9.2 oz)       Intake/Output Summary:  (Last 24 hours)    Intake/Output Summary (Last 24 hours) at 06/25/17 1953  Last data filed at 06/25/17 1746   Gross per 24 hour   Intake              740 ml   Output              850 ml   Net             -110 ml      Stool Occurrence: 1    Physical Exam  GEN:no acute distress, AA  CARDIAC: RRR, no murmur   RESP: CTA  ABD: soft nontender, non distended, normoactive bowel sounds   INT: no rashes   EXT: no edema   NEURO: alert and oriented x3, no focal deficits grossly       Lab Review  24-hour labs:    Results for orders placed or performed during the hospital encounter of 06/23/17 (from the past 24 hour(s))   CBC AND DIFF    Collection Time: 06/25/17  5:53 AM   Result Value Ref Range    White Blood Cells 13.7 (H) 4.5 - 11.0 K/UL    RBC 4.64 4.0 - 5.0 M/UL    Hemoglobin 14.8 12.0 - 15.0 GM/DL    Hematocrit 56.2 36 - 45 %    MCV 93.7 80 - 100 FL    MCH 31.8 26 - 34 PG    MCHC 33.9 32.0 - 36.0 G/DL    RDW 13.0 11 - 15 %    Platelet Count 209 150 - 400 K/UL    MPV 8.1 7 - 11 FL    Neutrophils 87 (H) 41 - 77 %    Lymphocytes 8 (L) 24 - 44 %    Monocytes 5 4 - 12 %    Eosinophils 0 0 - 5 %    Basophils 0 0 - 2 %    Absolute Neutrophil Count 11.80 (H) 1.8 - 7.0 K/UL    Absolute Lymph Count 1.20 1.0 - 4.8 K/UL    Absolute Monocyte Count 0.70 0 - 0.80 K/UL    Absolute Eosinophil Count 0.10 0 - 0.45 K/UL    Absolute Basophil Count 0.00 0 - 0.20 K/UL   BASIC METABOLIC PANEL    Collection Time: 06/25/17  5:53 AM   Result Value Ref Range    Sodium 129 (L) 137 - 147 MMOL/L    Potassium 3.6 3.5 - 5.1 MMOL/L    Chloride 101 98 - 110 MMOL/L    CO2 21 21 - 30 MMOL/L    Anion Gap 7 3 - 12    Glucose 117 (H) 70 - 100 MG/DL    Blood Urea Nitrogen 15 7 - 25 MG/DL    Creatinine 8.65 0.4 - 1.00 MG/DL    Calcium 8.4 (L) 8.5 - 10.6 MG/DL    eGFR Non African American >60 >60 mL/min    eGFR African American >60 >60 mL/min       Point of Care Testing  (Last 24 hours)  Glucose: (!) 117 (06/25/17 9811)    Radiology and other Diagnostics Review:    Pertinent radiology reviewed.    Gladis Riffle, MD   Med Private N Team pager [phone]

## 2017-06-26 NOTE — Med Student Progress Note
Internal Medicine Progress Note  Medical Student  ???  Name:  Tara Diaz  DOB:  1930-02-26  MRN:  [id number]  Date:  06/26/2017 6:52 AM    Admission:  06/23/2017  LOS:  3         Assessment and Plan     Tara Diaz is a 83 y.o. female with past medical history of restless leg syndrome, recent left shoulder replacement complicated by post-operative infection followed by C. Diff infection requiring three doses of medication, and colon cancer, admitted for severe sepsis likely secondary to recurrent C diff infection.        Hospital Course    Patient underwent left shoulder replacement in December 2017, which was complicated by a post-operative infection requiring 10 days of IV antibiotics.  Shortly after antibiotics course in March 2018 patient developed C. diff infection.  She completed two normal courses of oral antibiotics and a tapered regimen of oral antibiotics most recently in May 2018.  Patient also had an endometrial polyp removed within the past month.  Patient's husband has beenin the SICU at [facility] after surgery for colon tumor resection complicated by post-operative bleeding and pneumonia for the past week.  On 09/07 patient complained of fatigue and weakness, and became unable to support herself while walking, where previously she had been independent.  Her family brought her to the ER where she was found to be oriented but lethargic, diaphoretic, and complaining of dizziness and weakness.  She began to have significant diarrhea, which occurred before receiving antibiotics.  She meet 4/4 SIRS criteria with T 38.1*C / HR 120 / RR 39 / WBC 18.1 and a lactic acidosis of 3.8.  Patient was given 1L bolus, and started on empiric vancomycin, cefepime, and azithromycin and was worked up for pneumonia or other infectious source.  CT chest was negative for acute pneumonia, and RVP / Legionella Urine Antigen / Strep Pneumo Antigen was negative.  Blood cultures have demonstrated no growth (09/08).  CT abd/pelvis was suggestive of colitis of ascending, transverse, and descending colon.  Patient was started on oral Vancomycin for presumed C diff, empiric IV vancomycin and azithromycin were discontinued, and cefepime continued due to history of recent abdominal procedure.  C Diff PCR returned positive, cefepime was discontinued.  Patients sepsis, diarrhea, and fatigue improved with oral vancomycin. Patient safe for medical discharge on 09/10.  Plan follow up with outpatient ID physician, and follow up as previously scheduled with patient's OB/Gyn.      1. Severe Sepsis secondary to C Diff Infection: Resolved  Lactic Acidosis: Resolved    - Likely secondary to C diff with history of recurrent infection (last 02/2017), presence of diarrhea, and CT Abd/Pelvis demonstrating colitis  - Less likely secondary to recent endometrial procedure  - Shoulder replacement (09/2016) with post-operative infection (12/2016), unlikely source given CT Abd/Pelvis finidngs  - 4/4 SIRS on admission 09/07  - lactic acidosis on admission 09/07 of 3.8, since resolved to 2.0 on 09/08  - Started on empiric vancomycin, cefepime, and azithromycin on admission  - Normal CT Chest --> discontinued vancomycin IV and azithromycin  - CT Abd/Pelvis demonstrating colitis --> Treat C Diff as below  - C Diff Positive --> Discontinued Cefepime    SIRS (09/07):  T 38.1*C / HR 120 / RR 39 / WBC 18.1 / Lactic Acid 3.8    (09/08):  T 36.6*C / HR 100 / RR 30 / WBC 18.6 / Lactic Acid 2.0   (09/09):  T 37.6*C / HR 114 / RR 30 / WBC 13.7   (09/10):  T 36.8*C / HR 76  / RR 18 / WBC 7.5    Blood Cultures (09/07):  No growth, 3 days (09/10)  RVP / Legionella / Strep Pneumo (09/07):  Negative    Inflammatory (09/08):  ESR 18 / CRP 11.35  Procalcitonin (09/08):  0.35    CT Chest (09/07):  No acute pneumonia or other infectious process  CT Abd/Pelvis (09/08):  Mild pericolonic stranding involving ascending, transverse, and descending colon consistent with colitis    Antimicrobial Start date End date Reason Discontinued   09/07 09/08 Pneumonia Ruled Out   09/07 09/08 Pneumonia Ruled Out    09/07 09/08 C Diff positive, most likely source   PO Vancomycin 09/08       Plan:  > FOLLOW UP (09/07) Blood Cultures x 2    > Treat C Diff as below  > Trend Procalcitonin  ???    2. Recurrent C Diff Infection  Diarrhea    - (March 2018)  C Diff after 10 days IV antibiotics for post-operative infection from shoulder surgery in December 2017.  - Patient has had 3 courses oral antibiotics for C. Diff including last episode with prolonged taper last in May 2017  - Followed with ID Dr. Tawanna Cooler in [facility].  - New onset watery diarrhea day of admission before receiving antibiotics  - Started Oral vancomycin for treatment of presumed C Diff with CT Abd/Pelvis (09/08) indicating colitis with new onset diarrhea; C Diff PCR Positive  - ID consult suggest consideration for fecal microbiota transplant with 3rd recurrence of C Diff Infection.  - ID consult suggest vancomycin taper with chronic suppression oral vancomycin given patient's age and history of recurrent C Diff    C DIff PCR (09/08):  Positive    CT Abd/Pelvis (09/08):  Mild pericolonic stranding involving ascending, transverse, and descending colon consistent with colitis    Plan:  > DECREASE PO Vancomycin 125 mg four times daily, continue for 14 days  > CONTINUE Lactobaillus rhamnosus BID with meals  > ID Following    On Discharge:  > CONTINUE PO Vancomycin 125 mg four times daily for 14 days  ??? Start date 06/26/2017  > THEN Oral Vancomycin 125 mg daily chronically  > FOLLOW UP with ID Outpatient  ??? Dr. Tawanna Cooler in [place], patient's ID doctor PTA        3. H/o Endometrial polyp s/p resection  Endometrial thickening    - (09/05)  Patient had endometrial polyps removed, no known complications  - CT Abd/Pelvis (09/07) demonstrated mild endometrial thickening - Patient denies abnormal bleeding, discharge, or pelvic pain  - Cefepime started initially due to history of pelvic procedure, discontinued when C Diff positive    On Discharge:  > FOLLOW UP with OB/GYN in [place], [place] as previously scheduled      4. H/o Left Shoulder Arthoplasty  Complicated by post-operative hematoma and infection    - (09/2016)  Arthroplasty  - (10/2016)  Hematoma development, returned to OR for evacuation  - Per son received 10 days unknown  IV antibiotics after evacuation, received daily      5. Hyponatremia: Improving    - (09/08, Admit) Sodium 134  - (09/08-09/10)  Sodium 129-135  - (09/10)  Sodium 135    Plan:  > CONTINUE To Monitor with daily BMP      6. Glaucoma    Plan:  >  CONTINUE PTA Bimatoprost right eye one drop at night      7. Restless Leg Syndrome    - PTA pramipexole 0.25 mg nightly / ropinirole 1 mg nightly  - Recently started on Ropinirole 1 mg nightly  - Held on admission for remote possibility of symptom complex resembling NMS    Plan:  > CONTINUE HOLD PTA Ropinirole 1 mg nightly  > CONTINUE HOLD PTA Pramipexole 0.25 mg nightly  > May continue on discharge      8. Other PTA Medications    Plan:  > CONTINUE HOLD PTA Ergocalciferol   > CONTINUE HOLD PTA Ibuprofen 200 mg daily PRN pain  > CONTINUE HOLD PTA Vitamin E 100U daily  > CONTINUE HOLD PTA Multivitamin daily      FEN: No IVF / Monitor Electrolytes, replace PRN / Regular Diet  PPx DVT:  Enoxaparin 40 mg SubQ daily  Lines/drains:???Peripheral IV x2, < 1 day / 1 day (09/08)    Code Status: Full Code    Disposition:  Patient to discharge home today 09/10  PT Recs:  Home with Assistance, patient owns necessary equipment      Rosie Fate, MS    Interval History     Patient had 4 episodes of Loose Stools yesterday that improved in consistency.  Patient remains afebrile and her leukocytosis resolved, she reports increased strength and is comfortable with a discharge home today. Patient denies subjective fevers, chills, dyspnea, cough, nausea, vomiting, abdominal pain, diarrhea, dysuria, or rashes.    Medications     Hospital Medications  Scheduled Meds:  enoxaparin (LOVENOX) syringe 40 mg 40 mg Subcutaneous QDAY(21)   lactobacillus rhamnosus GG (CULTURELLE) 15 billion cell capsule 1 capsule 1 capsule Oral BID w/meals   latanoprost (XALATAN) 0.005 % ophthalmic solution 1 drop 1 drop Right Eye QHS   vancomycin (FIRVANQ) oral solution 250 mg 250 mg Oral QID     Continuous Infusions:  PRN and Respiratory Meds:acetaminophen Q6H PRN, ondansetron (ZOFRAN) IV Q6H PRN    Physical Exam     Vital Signs:  Last Filed in 24 hours Vital Signs:  24 hour Range    BP: 110/68 (09/10 0340)  Temp: 36.8 ???C (98.2 ???F) (09/10 0340)  Pulse: 76 (09/10 0508)  Respirations: 18 PER MINUTE (09/10 0340)  SpO2: 94 % (09/10 0508)  O2 Delivery: None (Room Air) (09/10 0340) BP: (110-126)/(57-74)   Temp:  [36.8 ???C (98.2 ???F)-37 ???C (98.6 ???F)]   Pulse:  [69-100]   Respirations:  [16 PER MINUTE-18 PER MINUTE]   SpO2:  [92 %-96 %]   O2 Delivery: None (Room Air)   Body mass index is 37.32 kg/m???.    Physical Exam  General:  Alert and oriented to person, place, time, and situation.  Well developed and well nourished  HEENT:  Head:  Normocephalic and atraumatic  Eyes:  Pupils equal, round, and reactive to light.  EOM intact.  Conjunctivae normal.  Neck:  Supple, normal range of motion.  No lymphadenopathy.  Throat:  Mucus membranes moist.  No erythema, exudates, oral lesions or thrush.  Cardiovascular:  Normal rate and rhythm.  No murmurs, rubs or gallops.  No heaves, lifts, or thrills.  Pulmonary:  Lungs clear bilaterally to auscultation with no wheezes or rhonchi.  Effort appropriate  Abdominal:  Soft, non-distended.  LLQ surgical scar.  Normoactive bowel sounds in all four quadrants.  No tenderness to palpation, no rebound or guarding.  No masses or hepatosplenomegaly. Neuro:  No gross neurological deficits,  moving all limbs spontaneously  Extremities:   No clubbing, cyanosis, or edema of bilateral extremities.  Radial and posterior tibial pulses 2+ bilaterally.  Capillary refill upper and lower extremities < 2 seconds bilaterally.  Left Shoulder:  Well healed surgical scar, no joint redness, tenderness, or edema.  Skin:  Warm, dry and intact.  No bruising, cyanosis, rashes, or abnormal lesions noted.    Labs     Results for orders placed or performed during the hospital encounter of 06/23/17 (from the past 48 hour(s))   D-DIMER    Collection Time: 06/24/17  7:50 AM   # # Low-High    D-Dimer 1,385 (H) <500 ng/mL FEU   C DIFFICILE BY PCR    Collection Time: 06/24/17  9:12 AM   # # Low-High    Battery Name C DIFFICILE PCR     Specimen Description FECES     Special Requests NONE     C. Difficile Toxin B PCR       POSITIVE-wait 14 days to repeat test  Notified Fred at 1600 9.8.18 dl      Report Status FINAL  06/24/2017      CBC AND DIFF    Collection Time: 06/25/17  5:53 AM   # # Low-High    White Blood Cells 13.7 (H) 4.5 - 11.0 K/UL    RBC 4.64 4.0 - 5.0 M/UL    Hemoglobin 14.8 12.0 - 15.0 GM/DL    Hematocrit 45.4 36 - 45 %    MCV 93.7 80 - 100 FL    MCH 31.8 26 - 34 PG    MCHC 33.9 32.0 - 36.0 G/DL    RDW 09.8 11 - 15 %    Platelet Count 209 150 - 400 K/UL    MPV 8.1 7 - 11 FL    Neutrophils 87 (H) 41 - 77 %    Lymphocytes 8 (L) 24 - 44 %    Monocytes 5 4 - 12 %    Eosinophils 0 0 - 5 %    Basophils 0 0 - 2 %    Absolute Neutrophil Count 11.80 (H) 1.8 - 7.0 K/UL    Absolute Lymph Count 1.20 1.0 - 4.8 K/UL    Absolute Monocyte Count 0.70 0 - 0.80 K/UL    Absolute Eosinophil Count 0.10 0 - 0.45 K/UL    Absolute Basophil Count 0.00 0 - 0.20 K/UL   BASIC METABOLIC PANEL    Collection Time: 06/25/17  5:53 AM   # # Low-High    Sodium 129 (L) 137 - 147 MMOL/L    Potassium 3.6 3.5 - 5.1 MMOL/L    Chloride 101 98 - 110 MMOL/L    CO2 21 21 - 30 MMOL/L    Anion Gap 7 3 - 12 Glucose 117 (H) 70 - 100 MG/DL    Blood Urea Nitrogen 15 7 - 25 MG/DL    Creatinine 1.19 0.4 - 1.00 MG/DL    Calcium 8.4 (L) 8.5 - 10.6 MG/DL    eGFR Non African American >60 >60 mL/min    eGFR African American >60 >60 mL/min   CBC AND DIFF    Collection Time: 06/26/17  4:48 AM   # # Margarito Liner    White Blood Cells 7.5 4.5 - 11.0 K/UL    RBC 4.40 4.0 - 5.0 M/UL    Hemoglobin 13.9 12.0 - 15.0 GM/DL    Hematocrit 14.7 36 - 45 %    MCV 93.7 80 -  100 FL    MCH 31.7 26 - 34 PG    MCHC 33.8 32.0 - 36.0 G/DL    RDW 16.1 11 - 15 %    Platelet Count 215 150 - 400 K/UL    MPV 8.1 7 - 11 FL    Neutrophils 77 41 - 77 %    Lymphocytes 14 (L) 24 - 44 %    Monocytes 6 4 - 12 %    Eosinophils 3 0 - 5 %    Basophils 0 0 - 2 %    Absolute Neutrophil Count 5.80 1.8 - 7.0 K/UL    Absolute Lymph Count 1.10 1.0 - 4.8 K/UL    Absolute Monocyte Count 0.40 0 - 0.80 K/UL    Absolute Eosinophil Count 0.20 0 - 0.45 K/UL    Absolute Basophil Count 0.00 0 - 0.20 K/UL   BASIC METABOLIC PANEL    Collection Time: 06/26/17  4:48 AM   # # Low-High    Sodium 135 (L) 137 - 147 MMOL/L    Potassium 3.4 (L) 3.5 - 5.1 MMOL/L    Chloride 104 98 - 110 MMOL/L    CO2 24 21 - 30 MMOL/L    Anion Gap 7 3 - 12    Glucose 101 (H) 70 - 100 MG/DL    Blood Urea Nitrogen 16 7 - 25 MG/DL    Creatinine 0.96 0.4 - 1.00 MG/DL    Calcium 8.6 8.5 - 04.5 MG/DL    eGFR Non African American >60 >60 mL/min    eGFR African American >60 >60 mL/min          Radiology, Microbiology, and other Diagnostics Review

## 2017-06-26 NOTE — Progress Notes
Tara Diaz discharged on 06/26/2017.   Marland Kitchen  Discharge instructions reviewed with patient.  Valuables returned: N/A   .  Home medications: N/A   .  Functional assessment at discharge complete: Yes .      Pt without further questions or concerns at this time.

## 2017-06-26 NOTE — Progress Notes
OCCUPATIONAL THERAPY  ASSESSMENT NOTE    Patient Name: Tara Diaz                   Room/Bed: OX7353/29  Admitting Diagnosis:   Past Medical History:   Diagnosis Date    Colon cancer ([place])     Glaucoma     History of Clostridium difficile infection     Restless leg syndrome        Patient denies changes from baseline ADLs and functional mobility and reports no history of balance loss or falls in the last three months.  Patient has not had a procedure or surgery that would make getting dressed difficult, including putting on socks and shoes.  Patient has not demonstrated or reported new difficulties with vision when completing functional tasks.  Patient endorses no concerns with functional skills at home.  Currently, the patient is ambulating (in room/ on unit) without difficulty.    Encouraged patient to continue to perform functional skills/ADLs while in the hospital and discussed the ability for the patient to complete their ADLs with the bedside nursing staff.  Occupational therapy services will be discontinued at this time, please re-consult if the patient has a change in functional status.      Therapist: Alla German, OT  Date: 06/26/2017

## 2017-06-27 NOTE — Discharge Instructions - Pharmacy
Physician Discharge Summary      Name: Tara Diaz  Medical Record Number: [id number]        Account Number:  [id number]  Date Of Birth:  December 05, 1929                         Age:  83 years   Admit date:  06/23/2017                     Discharge date:  06/26/2017    Attending Physician: Gladis Riffle, MD                 Service: Med Private [phone]    Physician Summary completed by: Gladis Riffle, MD    Reason for hospitalization: Sepsis    Significant PMH:   Past Medical History:   Diagnosis Date   ??? Colon cancer (HCC)    ??? Glaucoma    ??? History of Clostridium difficile infection    ??? Restless leg syndrome        Allergies: Pcn [penicillins]    Admission Physical Exam notable for:  Vital Signs: Last Filed In 24 Hours Vital Signs: 24 Hour Range   BP: 142/61 (09/07 2200)  Temp: 36.9 ???C (98.5 ???F) (09/07 2134)  Pulse: 123 (09/07 2205)  Respirations: 26 PER MINUTE (09/07 2205)  SpO2: 94 % (09/07 2205)  O2 Delivery: None (Room Air) (09/07 2134)  SpO2 Pulse: 123 (09/07 2205)  Height: 147.3 cm (58) (09/07 1819) BP: (138-179)/(48-80)   Temp:  [36.9 ???C (98.5 ???F)-38.1 ???C (100.6 ???F)]   Pulse:  [114-124]   Respirations:  [19 PER MINUTE-39 PER MINUTE]   SpO2:  [93 %-96 %]   O2 Delivery: None (Room Air)    ???   ???  GEN: not in acute distress, lethargic.    HEAD: normocephalic, atraumatic   EYES: sclera non-icteric, EOMI  NECK: no jvd, trachea midline   CARDIAC: NSR, normal S1S2, no MRG, no S3 or S4 noted. Tachycardic   RESP: inspiratory crackles heard on RML, no rales or rhonchi. Symmetric expansion, unlabored.   ABD: soft nontender, non distended, normoactive bowel sounds   MSK: normal range of motion, normal strength, hip non tender on palpation.   INT: no lesions or rashes noted, warm to touch.   EXT: no edema, warm to touch, pulses 2+ bilaterally upper and lower extremities   NEURO: alert and oriented x3, spontaneously ambulated upper and lower extremities   PSYCH: normal affect, appropriate responses to questions. Admission Lab/Radiology studies notable for:  WBC 18.1    CXR  Poor depth of inspiration without acute cardiopulmonary abnormality.    Brief Hospital Course:  The patient was admitted and the following issues were addressed during this hospitalization: (with pertinent details).    83 y/o F with PMHx of RLS, recent L shoulder replacement comp w/ post-operative infection and c difficile, colon cancer admitted for severe sepsis   ???  ???  Severe Sepsis with lactic acidosis, secondary to C diff infection, diarrhea  -Unclear source of sepsis initially, was started on empiric vancomycin, cefepime, azithromycin, stopped next day. Stool positive for C. Difficile. Started p.o. vancomycin, continued with plan for taper on discharge.    -CT chest negative for acute pneumonia or infectious process, blood cultures neg, Urine legionella and urine strep pneumonia and RVP neg  -Lactate 3.9 on initial presentation, level normal on follow-up. Procalcitonin 0.35   -Was given iv fluids   -  CK normal, ESR normal, CRP 11.3   -CT abd/pelvis ; Mild pericolonic stranding involving the ascending, transverse, and descending colon (greatest around the ascending colon) compatible with colitis.  -Echo: Mild concentric LVH. Mild LV diastolic dysfunction. Hyperdynamic LV systolic function, EF 70%  -ID consulted  -Will follow with her outpatient ID physician on discharge    ???  Mild endometrial thickening   -Seen on CT abdomen  -Follows with Gyn, she had a uterine polyp removed in [place], [place] 3 days pta, will follow with Gyn as outpatient     Glaucoma - continued PTA Bimatoprost right eye one drop at night   ???  RLS - restarted PTA requip    Condition at Discharge: Stable    Discharge Diagnoses: Severe Sepsis with lactic acidosis, secondary to C diff infection, diarrhea    Hospital Problems        Active Problems    * (Principal)Severe sepsis (HCC)    Lactic acidosis    Leukocytosis    Lethargy          Surgical Procedures: None Significant Diagnostic Studies and Procedures: noted in brief hospital course    Consults:  ID    Patient Disposition: Home       Patient instructions/medications:     Activity as Tolerated   It is important to keep increasing your activity level after you leave the hospital.  Moving around can help prevent blood clots, lung infection (pneumonia) and other problems.  Gradually increasing the number of times you are up moving around will help you return to your normal activity level more quickly.  Continue to increase the number of times you are up to the chair and walking daily to return to your normal activity level. Begin to work toward your normal activity level at discharge     Report These Signs and Symptoms   Please contact your doctor if you have any of the following symptoms: temperature higher than 100 degrees F, uncontrolled pain, persistent nausea and/or vomiting or worse diarrhea     Questions About Your Stay   For questions or concerns regarding your hospital stay. Call [phone]   Discharging attending physician: Gladis Riffle [[id number]]      Regular Diet   You have no dietary restriction. Please continue with a healthy balanced diet.     Return Appointment   Please follow with your PCP in 1-2 weeks    Please follow with your ID doctor Dr Tawanna Cooler in 1-2 weeks     Please follow with your Gyn Doctor as previously scheduled        Current Discharge Medication List       START taking these medications    Details   lactobacillus rhamnosus GG (CULTURELLE) 15 billion cell cpSP capsule Take one capsule by mouth as directed twice daily with meals.  Qty: 90 capsule, Refills: 3    PRESCRIPTION TYPE:  Normal      vancomycin (FIRVANQ) 25 mg/mL oral solution Take 5mL four times daily for 12 days, then take 5mL twice daily x1 week, then 5mL daily for x1week, then take 5mL every other day x4 weeks.  Qty: 415 mL, Refills: 0    PRESCRIPTION TYPE:  Normal          CONTINUE these medications which have NOT CHANGED Details   bimatoprost(+) (LUMIGAN) 0.03 % ophthalmic solution Apply 1 drop to right eye as directed at bedtime daily.    PRESCRIPTION TYPE:  Historical Med  ergocalciferol (vitamin D2) (VITAMIN D PO) Take 1 capsule by mouth daily.    PRESCRIPTION TYPE:  Historical Med      ibuprofen (ADVIL) 200 mg tablet Take 200 mg by mouth daily as needed for Pain. Take with food.    PRESCRIPTION TYPE:  Historical Med      pramipexole (MIRAPEX) 0.25 mg tablet Take 0.25 mg by mouth at bedtime daily. 2 to 3 hours before bed    PRESCRIPTION TYPE:  Historical Med      vitamin E 100 unit capsule Take 100 Units by mouth daily.    PRESCRIPTION TYPE:  Historical Med      vitamins, multiple cap Take 1 capsule by mouth daily.    PRESCRIPTION TYPE:  Historical Med          The following medications were removed from your list. This list includes medications discontinued this stay and those removed from your prior med list in our system        rOPINIRole (REQUIP) 1 mg tablet                   Pending items needing follow up: None     Signed:  Gladis Riffle, MD  06/27/2017      cc:  Primary Care Physician:  Nicole Cella   Verified  Referring physicians:  No ref. provider found   Additional provider(s):

## 2017-06-29 LAB — CULTURE-BLOOD W/SENSITIVITY

## 2017-08-21 ENCOUNTER — Encounter: Admit: 2017-08-21 | Discharge: 2017-08-22 | Payer: MEDICARE

## 2017-08-21 ENCOUNTER — Encounter: Admit: 2017-08-21 | Discharge: 2017-08-21 | Payer: MEDICARE

## 2017-08-21 NOTE — Progress Notes
Pt. Admitted today to outside hospital with cdiff infection.  Pt. Recently finished a vancomycin taper for cdiff infection, pt. Has had multiple cdiff infections over the course of the year.  Pt. Is stable on the floor and sending is going to start antibiotics.  Sending consulted with GI, who thought that a transfer to [place] and evaluation for a fecal transplant was appropriate.

## 2017-08-22 ENCOUNTER — Inpatient Hospital Stay
Admit: 2017-08-22 | Discharge: 2017-08-25 | Disposition: A | Discharge status: Home / Self Care | Payer: MEDICARE | Source: Other Acute Inpatient Hospital

## 2017-08-22 DIAGNOSIS — R69 Illness, unspecified: ICD-10-CM

## 2017-08-22 MED ORDER — ONDANSETRON HCL (PF) 4 MG/2 ML IJ SOLN
4 mg | INTRAVENOUS | 0 refills | Status: DC | PRN
Start: 2017-08-22 — End: 2017-08-25

## 2017-08-22 MED ORDER — ENOXAPARIN 40 MG/0.4 ML SC SYRG
40 mg | Freq: Every day | SUBCUTANEOUS | 0 refills | Status: DC
Start: 2017-08-22 — End: 2017-08-25
  Administered 2017-08-23 – 2017-08-24 (×2): 40 mg via SUBCUTANEOUS

## 2017-08-22 MED ORDER — SODIUM CHLORIDE 0.9 % IV SOLP
INTRAVENOUS | 0 refills | Status: AC
Start: 2017-08-22 — End: ?
  Administered 2017-08-23 – 2017-08-24 (×4): 1000.000 mL via INTRAVENOUS

## 2017-08-22 MED ORDER — ACETAMINOPHEN 325 MG PO TAB
650 mg | ORAL | 0 refills | Status: DC | PRN
Start: 2017-08-22 — End: 2017-08-25

## 2017-08-22 MED ORDER — LACTOBACILLUS RHAMNOSUS GG 15 BILLION CELL PO CPSP
1 | Freq: Two times a day (BID) | ORAL | 0 refills | Status: DC
Start: 2017-08-22 — End: 2017-08-25
  Administered 2017-08-23 – 2017-08-24 (×4): 1 via ORAL

## 2017-08-22 MED ORDER — PRAMIPEXOLE 0.25 MG PO TAB
.25 mg | Freq: Every evening | ORAL | 0 refills | Status: DC
Start: 2017-08-22 — End: 2017-08-25
  Administered 2017-08-23 – 2017-08-24 (×2): 0.25 mg via ORAL

## 2017-08-22 MED ORDER — VANCOMYCIN 25 MG/ML PO SOLR
125 mg | Freq: Four times a day (QID) | ORAL | 0 refills | Status: DC
Start: 2017-08-22 — End: 2017-08-22

## 2017-08-22 MED ORDER — LATANOPROST 0.005 % OP DROP
1 [drp] | Freq: Every evening | OPHTHALMIC | 0 refills | Status: DC
Start: 2017-08-22 — End: 2017-08-25
  Administered 2017-08-23: 03:00:00 1 [drp] via OPHTHALMIC

## 2017-08-22 NOTE — Care Coordination-Inpatient
Transfer acceptance note:     C diff recurrent infection, 83 yo F, seeing Dr. Reche Dixon in [place]. Admitted currently with active infection. Discussed with Esfandari who recs inpatient fecal transplant due to her age.     Vertell Novak, MD

## 2017-08-22 NOTE — Progress Notes
Team paged that pt. Arrived on the floor.

## 2017-08-22 NOTE — Progress Notes
Patient arrived on unit via cart accompanied by EMS. Patient transferred to the bed with assistance. Assessment completed, refer to flowsheet for details. Orders released, reviewed, and implemented as appropriate. Oriented to surroundings, call light within reach. Plan of care reviewed.  Will continue to monitor and assess.      Patient arrived to room # [id number]) via cart accompanied by EMS. Patient transferred to the bed with assistance. Bedside safety checks completed. Initial patient assessment completed, refer to flowsheet for details. Admission skin assessment completed by: Lilia Pro, RN and Briant Angelillo, RN    Pressure Injury Present on Hospital Admission (within 24 hours): No    1. Occiput: No  2. Ear: No  3. Scapula: No  4. Spinous Process: No  5. Shoulder: No  6. Elbow: No  7. Iliac Crest: No  8. Sacrum/Coccyx: No  9. Ischial Tuberosity: No  10. Trochanter: No  11. Knee: No  12. Malleolus: No  13. Heel: No  14. Toes: No  15. Assessed for device associated injury Yes  16. Nursing Nutrition Assessment Completed Yes    See Doc Flowsheet for additional wound details.     INTERVENTIONS: None needed at this time.

## 2017-08-23 ENCOUNTER — Inpatient Hospital Stay: Admit: 2017-08-23 | Discharge: 2017-08-23 | Payer: MEDICARE

## 2017-08-23 ENCOUNTER — Encounter: Admit: 2017-08-23 | Discharge: 2017-08-23 | Payer: MEDICARE

## 2017-08-23 DIAGNOSIS — H409 Unspecified glaucoma: ICD-10-CM

## 2017-08-23 DIAGNOSIS — C189 Malignant neoplasm of colon, unspecified: ICD-10-CM

## 2017-08-23 DIAGNOSIS — Z8619 Personal history of other infectious and parasitic diseases: ICD-10-CM

## 2017-08-23 DIAGNOSIS — G2581 Restless legs syndrome: Principal | ICD-10-CM

## 2017-08-23 LAB — CBC AND DIFF: Lab: 10 K/UL — ABNORMAL LOW (ref 4.5–11.0)

## 2017-08-23 LAB — BASIC METABOLIC PANEL: Lab: 136 MMOL/L — ABNORMAL LOW (ref 137–147)

## 2017-08-23 MED ORDER — BISACODYL 5 MG PO TBEC
10 mg | Freq: Once | ORAL | 0 refills | Status: AC | PRN
Start: 2017-08-23 — End: ?

## 2017-08-23 MED ORDER — LACTATED RINGERS IV SOLP
INTRAVENOUS | 0 refills | Status: CN
Start: 2017-08-23 — End: ?

## 2017-08-23 MED ORDER — PEG-ELECTROLYTE SOLN 420 GRAM PO SOLR
4 L | ORAL | 0 refills | Status: DC
Start: 2017-08-23 — End: 2017-08-25
  Administered 2017-08-23: 4 L via ORAL

## 2017-08-23 MED ORDER — ACETAMINOPHEN 325 MG PO TAB
650 mg | ORAL | 0 refills | Status: CN | PRN
Start: 2017-08-23 — End: ?

## 2017-08-23 MED ORDER — PEG-ELECTROLYTE SOLN 420 GRAM PO SOLR
2 L | ORAL | 0 refills | Status: DC | PRN
Start: 2017-08-23 — End: 2017-08-25

## 2017-08-23 NOTE — Consults
This consult has been satisfied. To see the full consult note, please refer to the GI progress note.

## 2017-08-23 NOTE — Progress Notes
I have reviewed the notes, assessment, and/or procedures performed by Lianne Moris, RN and concur with her/his documentation unless otherwise noted.

## 2017-08-23 NOTE — Anesthesia Pre-Procedure Evaluation
Anesthesia Pre-Procedure Evaluation    Name: Tara Diaz      MRN: [id number]     DOB: 09/30/1930     Age: 83 y.o.     Sex: female   __________________________________________________________________________     Procedure Date: 08/24/2017   Procedure: Procedure(s):  COLONOSCOPY  PREPARATION FECAL MICROBIOTA FOR INSTILLATION WITH ASSESSMENT DONOR SPECIMEN     Physical Assessment  Vital Signs (last filed in past 24 hours):  BP: 140/77 (11/07 1700)  Temp: 36.5 ???C (97.7 ???F) (11/07 1522)  Pulse: 95 (11/07 1522)  Respirations: 17 PER MINUTE (11/07 1522)  SpO2: 98 % (11/07 1522)  O2 Delivery: None (Room Air) (11/07 1522)  Height: 147.3 cm (58) (11/07 1305)  Weight: 80.5 kg (177 lb 7.5 oz) (11/07 1305)      Patient History  Allergies   Allergen Reactions   ??? Pcn [Penicillins] UNKNOWN        Current Medications    Medication Directions   bimatoprost(+) (LUMIGAN) 0.03 % ophthalmic solution Apply 1 drop to right eye as directed at bedtime daily.   ibuprofen (ADVIL) 200 mg tablet Take 200 mg by mouth daily as needed for Pain. Take with food.   pramipexole (MIRAPEX) 0.25 mg tablet Take 0.25 mg by mouth at bedtime daily. 2 to 3 hours before bed   vitamins, multiple cap Take 1 capsule by mouth daily.         Review of Systems/Medical History      Patient summary reviewed  Nursing notes reviewed  Pertinent labs reviewed    PONV Screening: Female gender and Non-smoker  No history of anesthetic complications  No family history of anesthetic complications      Airway - negative        Pulmonary             Sleep apnea          Interventions: CPAP; compliant      Cardiovascular       Recent diagnostic studies:          echocardiogram            Technically difficult study due to poor patient cooperation  Mild concentric LVH  Mild LV diastolic dysfunction  Hyperdynamic LV systolic function, EF 70%  ECHO 06/24/2017:        Exercise tolerance: <4 METS      Beta Blocker therapy: No      Beta blockers within 24 hours: n/a Hypertension,       GI/Hepatic/Renal       Inflammatory bowel disease        GERD, well controlled      REASON FOR ADMISSION/PROCEDURE  Diarrhea  Abdominal pain (cramping)  Chronic C. Diff. Infections  Hx of colon cancer s/p colectomy   Septic      Neuro/Psych - negative        Musculoskeletal - negative        Endocrine/Other         Malignancy      Hx of colon cancer  Leukocytosis  Restless leg syndrome   Physical Exam    Airway Findings      Mallampati: II      TM distance: >3 FB      Neck ROM: full      Mouth opening: good    Dental Findings:             Cardiovascular Findings: Negative      Rhythm: regular  Rate: normal    Pulmonary Findings: Negative      Breath sounds clear to auscultation.    Neurological Findings: Negative         Diagnostic Tests  Hematology:   Lab Results   Component Value Date    HGB 14.1 08/23/2017    HCT 41.9 08/23/2017    PLTCT 253 08/23/2017    WBC 10.2 08/23/2017    NEUT 69 08/23/2017    ANC 7.00 08/23/2017    ALC 1.80 08/23/2017    MONA 11 08/23/2017    AMC 1.10 08/23/2017    EOSA 2 08/23/2017    ABC 0.00 08/23/2017    MCV 97.3 08/23/2017    MCH 32.7 08/23/2017    MCHC 33.6 08/23/2017    MPV 7.9 08/23/2017    RDW 14.2 08/23/2017         General Chemistry:   Lab Results   Component Value Date    NA 136 08/23/2017    K 3.7 08/23/2017    CL 106 08/23/2017    CO2 23 08/23/2017    GAP 7 08/23/2017    BUN 11 08/23/2017    CR 0.80 08/23/2017    GLU 94 08/23/2017    CA 8.6 08/23/2017    ALBUMIN 4.5 06/23/2017    LACTIC 2.0 06/24/2017    TOTBILI 0.6 06/23/2017      Coagulation:   Lab Results   Component Value Date    PTT 26.3 06/24/2017    INR 1.2 06/24/2017         Anesthesia Plan    ASA score: 3   Plan: MAC  Induction method: intravenous      Informed Consent  Anesthetic plan and risks discussed with patient.  Use of blood products discussed with patient  Blood Consent: consented      Plan discussed with: SRNA. Comments: (Questions answered. Pt understands possibility of GA.  Risks of GA with LMA/ETT explained including sore throat, oral/dental injury, allergic reactions, PONV, aspiration, respiratory failure, MI, CVA discussed with patient who reports understanding and consents to anesthetic plan.)

## 2017-08-23 NOTE — Case Management (ED)
Case Management Admission Assessment    NAME:Tara Diaz                          MRN: [id number]             DOB:[birth year]          AGE: 83 y.o.  ADMISSION DATE: 08/22/2017             DAYS ADMITTED: LOS: 1 day      Today???s Date: 08/23/2017    Source of Information: patient, son       Plan  Plan: CM Assessment, Discharge Planning for Home Anticipated   Anticipate DC home with family support.   CM team continue to follow and assess additional CM needs.    Patient Address/Phone  [address]  [place] [address]  [phone] (home)     Emergency Contact  Extended Emergency Contact Information  Primary Emergency Contact: Jaziyah, [place]  Home Phone: [phone]  Mobile Phone: [phone]  Relation: Son    Systems developer  Does the patient need discharge transport arranged?: No  Transportation Name, Phone and Availability #1: family/ son Rosanne Ashing  Does the patient use Medicaid Transportation?: No    Expected Discharge Date       Living Situation Prior to Admission  ? Living Arrangements  Type of Residence: Home, independent  Living Arrangements: Alone  How many levels in the residence?: 2 (basement)  Can patient live on one level if needed?: Yes  Does residence have entry and/or side stairs?: Yes (ramp to enter)  Assistance needed prior to admit or anticipated on discharge: No  Who provides assistance or could if needed?: son  ? Level of Function   Prior level of function: Independent  ? Cognitive Abilities   Cognitive Abilities: Alert and Oriented, Participates in decision making, Engages in problem solving and planning    Financial Resources  ? Coverage  Primary Insurance: Medicare Replacement [facility] Medicare)  Additional Coverage: RX (Fills at [facility])    ? Source of Income      ? Financial Assistance Needed?  none    Psychosocial Needs  ? Mental Health  Mental Health History: No  ? Substance Use History     ? Other  none    Current/Previous Services  ? PCP Nicole Cella, [phone], [phone]  ? Pharmacy    [phone] [place] [place] - [address]  [address]  [place] [address]  Phone: [phone] Fax: [phone]    ? Durable Psychologist, educational at home: Dan Humphreys, Single [employer], [employer], CPAP/BiPAP  ? Home Health  Receiving home health: In the past  Agency name: [facility]  ? Hemodialysis or Peritoneal Dialysis  Undergoing hemodialysis or peritoneal dialysis: No  ? Tube/Enteral Feeds  Receive tube/enteral feeds: No  ? Infusion  Receive infusions: In the past  Where: home  Infusion company: unsure of agency for IV abx  ? Private Duty  Private duty help used: No  ? Home and Community Based [employer] and community based services: No  ? Zackery Barefoot [facility]: N/A  ? Hospice  Hospice: No  ? Outpatient Therapy  PT: No  OT: No  SLP: No  ? Skilled Nursing Facility/Nursing Home  SNF: No  NH: No  ? Inpatient Rehab  IPR: No  ?  Long-Term Acute Care Hospital  LTACH: No  ? Acute Hospital Stay  Acute Hospital Stay: In the past  Was patient's stay within the last 30 days?: No  When did patient receive care?: Sept 2018  Name of hospital: Minda Meo RN BSN  Integrated Nurse Case Manager  [phone]/ [phone]

## 2017-08-23 NOTE — Consults
Gastroenterology Consult Note  Patient Name:Tara Diaz         ZOX:[id number]  Admission Date: 08/22/2017  4:27 PM      Active Problems:    Recurrent Clostridium difficile diarrhea    Sepsis (HCC)    Colon cancer (HCC)      Reason for consult: Recurrent C. Diff colitis    Assessment:  Tara Diaz is a 83 y.o. female with history of colon cancer s/p colectomy, RLS, glaucoma, and recurrent c diff infections admitted for consideration of fecal transplant.    Recommendations:  - Please check a repeat C. difficile  - Begin bowel prep this afternoon  - Will perform colonoscopy with FMT tomorrow    Patient discussed with attending on service, Dr. Burnis Medin.    Ernest Mallick, MD  Internal Medicine, PGY1  Pager [phone]      -----------------------------  HPI/Subjective:  Tara Diaz is a 83 y.o. female with history of colon cancer s/p colectomy, RLS, glaucoma, and recurrent c diff infections admitted for consideration of fecal transplant. Per patient, she had her first episode of C. Diff in February 2018, after prolonged course of IV abx for infection and abscess s/p shoulder repair. She states that she has had recurrent episodes of C. Diff since, about 5 total episodes per her estimation. She notes that her longest period without symptoms was a few months over the summer. Her last episode of C. Diff was in September 2018. She was treated with vancomycin taper, which she finished on 10/28. Two days prior to presentation at [facility], she noticed worsening watery diarrhea, fever, leukocytosis and rolling abdominal pain. She was admitted to [facility], where she tested positive for C. Diff by stool PCR on 11/5. Vancomycin was not started. Transferred to [facility] on 11/6 for inpatient fecal transplant.    On exam this morning, patient still with frequent watery diarrhea. States that she feels that she is going every couple hours or so. Endorses generalized cramping and rolling abdominal pain, but states that it is tolerable. Denies any fevers, chills, N/V, hematochezia.     PMH:  Past Medical History:   Diagnosis Date   ??? Colon cancer (HCC)    ??? Glaucoma    ??? History of Clostridium difficile infection    ??? Restless leg syndrome        Current medications:  No current facility-administered medications on file prior to encounter.      Current Outpatient Prescriptions on File Prior to Encounter   Medication Sig Dispense Refill   ??? bimatoprost(+) (LUMIGAN) 0.03 % ophthalmic solution Apply 1 drop to right eye as directed at bedtime daily.     ??? ergocalciferol (vitamin D2) (VITAMIN D PO) Take 1 capsule by mouth daily.     ??? ibuprofen (ADVIL) 200 mg tablet Take 200 mg by mouth daily as needed for Pain. Take with food.     ??? lactobacillus rhamnosus GG (CULTURELLE) 15 billion cell cpSP capsule Take one capsule by mouth as directed twice daily with meals. 90 capsule 3   ??? pramipexole (MIRAPEX) 0.25 mg tablet Take 0.25 mg by mouth at bedtime daily. 2 to 3 hours before bed     ??? vancomycin (FIRVANQ) 25 mg/mL oral solution Take 5mL four times daily for 12 days, then take 5mL twice daily x1 week, then 5mL daily for x1week, then take 5mL every other day x4 weeks. 415 mL 0   ??? vitamin E 100 unit capsule  Take 100 Units by mouth daily.     ??? vitamins, multiple cap Take 1 capsule by mouth daily.       Past Surgical History:   Procedure Laterality Date   ??? COLONOSCOPY      colon cancer resection   ??? HX SHOULDER REPLACEMENT Left      Social History     Social History   ??? Marital status: Married     Spouse name: N/A   ??? Number of children: N/A   ??? Years of education: N/A     Occupational History   ??? Not on file.     Social History Main Topics   ??? Smoking status: Never Smoker   ??? Smokeless tobacco: Never Used   ??? Alcohol use No   ??? Drug use: No   ??? Sexual activity: Not on file     Other Topics Concern   ??? Not on file     Social History Narrative   ??? No narrative on file Family History   Problem Relation Age of Onset   ??? Cancer-Colon Other        Review of Systems:  General: No fevers, chills, weight loss or gain, B-symptoms.  Oropharynx: No lesion, ulcer.  Neck: No pain or decreased ROM.  Pulm: No dyspnea, cough.  CV: No palpitations, no chest pain.  Abdomen: Positive abdominal pain, diarrhea, frequency. No hematochezia, melena, nausea or vomiting  Extremities: No swelling, no deformity.  Heme: No for easy bleeding or bruising.  Neuro: No lightheadedness/dizziness, no LOC.  Skin: No rashes, no jaundice.  Please see HPI for additional pertinent documentation    Physical Exam:  Vitals:    08/22/17 1746 08/22/17 2219 08/23/17 0155 08/23/17 0328   BP: 143/77 140/65 135/53 121/59   Pulse: 94 94 88 89   Temp: 37.2 ???C (98.9 ???F) 37.1 ???C (98.8 ???F) 37.1 ???C (98.8 ???F) 36.8 ???C (98.2 ???F)   SpO2: 98% 95% 96% 97%   Height:         General - Alert and oriented, no acute distress. Pleasant, cooperative, conversant.  Head - Normocephalic, atraumatic.   Eyes -  EOMI grossly. No icterus or injection.   Oropharynx - No ulcer or bleeding, moist mucosa.  Neck - No swelling or tracheal deviation.   Pulmonary/Chest - Normal effort, normal breath sounds.  CV - Normal rate, regular rhythm, normal heart sounds and intact distal pulses.  Abd - ND, soft, Mild generalized tenderness to palpation. No hepatospenomegaly. Hyperactive bowel sounds. No masses palpable.  Extremities - Warm, dry.   Skin - No exposed rash, lesion.  Neurological - AAOx4, No gross deficit    Labs/Imaging:  Pertient labs/imaging was reviewed on initiation of progress note.   24-hour labs:    Results for orders placed or performed during the hospital encounter of 08/22/17 (from the past 24 hour(s))   CBC AND DIFF    Collection Time: 08/23/17  5:21 AM   Result Value Ref Range    White Blood Cells 10.2 4.5 - 11.0 K/UL    RBC 4.31 4.0 - 5.0 M/UL    Hemoglobin 14.1 12.0 - 15.0 GM/DL    Hematocrit 69.6 36 - 45 %    MCV 97.3 80 - 100 FL MCH 32.7 26 - 34 PG    MCHC 33.6 32.0 - 36.0 G/DL    RDW 29.5 11 - 15 %    Platelet Count 253 150 - 400 K/UL    MPV 7.9 7 -  11 FL    Neutrophils 69 41 - 77 %    Lymphocytes 18 (L) 24 - 44 %    Monocytes 11 4 - 12 %    Eosinophils 2 0 - 5 %    Basophils 0 0 - 2 %    Absolute Neutrophil Count 7.00 1.8 - 7.0 K/UL    Absolute Lymph Count 1.80 1.0 - 4.8 K/UL    Absolute Monocyte Count 1.10 (H) 0 - 0.80 K/UL    Absolute Eosinophil Count 0.20 0 - 0.45 K/UL    Absolute Basophil Count 0.00 0 - 0.20 K/UL   BASIC METABOLIC PANEL    Collection Time: 08/23/17  5:21 AM   Result Value Ref Range    Sodium 136 (L) 137 - 147 MMOL/L    Potassium 3.7 3.5 - 5.1 MMOL/L    Chloride 106 98 - 110 MMOL/L    CO2 23 21 - 30 MMOL/L    Anion Gap 7 3 - 12    Glucose 94 70 - 100 MG/DL    Blood Urea Nitrogen 11 7 - 25 MG/DL    Creatinine 1.61 0.4 - 1.00 MG/DL    Calcium 8.6 8.5 - 09.6 MG/DL    eGFR Non African American >60 >60 mL/min    eGFR African American >60 >60 mL/min     No pertinent radiology.

## 2017-08-23 NOTE — Progress Notes
Encounter Date: 08/23/2017 8:03 AM    Name: Tara Diaz   MRN: [id number]    Admission Date:  08/22/2017   LOS: 1 day       ASSESSMENT AND PLAN   83 y/o F with h/o colon cancer s/p colectomy, RLS, glaucoma, and recurrent c diff infections admitted for consideration of fecal transplant.  ???  Sepsis due to Recurrent C Difficile Infection  Today spoke with the gastroenterology fellow, repeat C. difficile is pending, colonoscopy in a.m.  Prior history of C. difficile, treated with p.o. vancomycin, last dose was September 2018  Patient was not taking vancomycin from 1028, she completed a course.  She continued to have diarrhea  Continue probiotics  Continue IV hydration  Monitor renal clearance closely  Replace electrolytes    Glaucoma  - Continue eye drops  ???  RLS  - Continue Requip  ???  FEN  - Diet: Regular  - IVF: NS @ 75 cc/hr  ???  Proph  - DVT: SCDs, Lovenox  ???  Dispo  - Full code  - Admit to IM    VTE Ppx:   Continuous Infusions:   ??? sodium chloride 0.9 %   infusion 75 mL/hr at 08/22/17 2034     CODE STATUS: Full Code    --------------------------------------------------------------------------------  SUBJECTIVE:  Gen: no fever, no chills  Cardio: no chest pain, no sob, no leg swellings, no syncope   Pulmonology: no sob, no wheezing  Continue to have diarrhea, had several bowel movements this morning, denies any nausea vomiting or abdominal pain        Medications:  Scheduled Meds:   enoxaparin (LOVENOX) syringe 40 mg 40 mg Subcutaneous QDAY(21)   lactobacillus rhamnosus GG (CULTURELLE) 15 billion cell capsule 1 capsule 1 capsule Oral BID w/meals   latanoprost (XALATAN) 0.005 % ophthalmic solution 1 drop 1 drop Right Eye QHS   pramipexole (MIRAPEX) tablet 0.25 mg 0.25 mg Oral QHS       OBJECTIVE:                       Vital Signs: Last Filed                 Vital Signs: 24 Hour Range   BP: 121/59 (11/07 0328)  Temp: 36.8 ???C (98.2 ???F) (11/07 0328)  Pulse: 89 (11/07 0328)  Respirations: 17 PER MINUTE (11/07 0328) SpO2: 97 % (11/07 0328)  O2 Delivery: None (Room Air) (11/07 0328)  Height: 147.3 cm (58) (11/06 1643) BP: (121-158)/(53-77)   Temp:  [36.8 ???C (98.2 ???F)-37.2 ???C (99 ???F)]   Pulse:  [88-95]   Respirations:  [16 PER MINUTE-18 PER MINUTE]   SpO2:  [95 %-98 %]   O2 Delivery: None (Room Air)     There were no vitals filed for this visit.      Intake/Output Summary:  (Last 24 hours)    Intake/Output Summary (Last 24 hours) at 08/23/17 0803  Last data filed at 08/23/17 0500   Gross per 24 hour   Intake              225 ml   Output              950 ml   Net             -725 ml      Stool Occurrence: (P) 1  Weight Change:    BMI: Body mass index is 37.32 kg/m???.  PHYSICAL EXAM:  Gen: , No distress, resting comfortably,   Neck: No JVD  Lungs: Normal breath sounds, no wheezing, normal air entry, no distress  Heart: S1, S2 present, RRR  Abdomen: Soft, non tender, BS present,   Extremities: No cyanosis, No edema   Phychiatric: Normal mood, normal affect  Neuro: Alert, Oriented times 3  Skin:  Warm, No jaundice. No cyanosis      Laboratory Data:  24-hour labs:    Results for orders placed or performed during the hospital encounter of 08/22/17 (from the past 24 hour(s))   CBC AND DIFF    Collection Time: 08/23/17  5:21 AM   Result Value Ref Range    White Blood Cells 10.2 4.5 - 11.0 K/UL    RBC 4.31 4.0 - 5.0 M/UL    Hemoglobin 14.1 12.0 - 15.0 GM/DL    Hematocrit 16.1 36 - 45 %    MCV 97.3 80 - 100 FL    MCH 32.7 26 - 34 PG    MCHC 33.6 32.0 - 36.0 G/DL    RDW 09.6 11 - 15 %    Platelet Count 253 150 - 400 K/UL    MPV 7.9 7 - 11 FL    Neutrophils 69 41 - 77 %    Lymphocytes 18 (L) 24 - 44 %    Monocytes 11 4 - 12 %    Eosinophils 2 0 - 5 %    Basophils 0 0 - 2 %    Absolute Neutrophil Count 7.00 1.8 - 7.0 K/UL    Absolute Lymph Count 1.80 1.0 - 4.8 K/UL    Absolute Monocyte Count 1.10 (H) 0 - 0.80 K/UL    Absolute Eosinophil Count 0.20 0 - 0.45 K/UL    Absolute Basophil Count 0.00 0 - 0.20 K/UL   BASIC METABOLIC PANEL Collection Time: 08/23/17  5:21 AM   Result Value Ref Range    Sodium 136 (L) 137 - 147 MMOL/L    Potassium 3.7 3.5 - 5.1 MMOL/L    Chloride 106 98 - 110 MMOL/L    CO2 23 21 - 30 MMOL/L    Anion Gap 7 3 - 12    Glucose 94 70 - 100 MG/DL    Blood Urea Nitrogen 11 7 - 25 MG/DL    Creatinine 0.45 0.4 - 1.00 MG/DL    Calcium 8.6 8.5 - 40.9 MG/DL    eGFR Non African American >60 >60 mL/min    eGFR African American >60 >60 mL/min     Glucose: 94 (08/23/17 0521)  No results found.      Al Corpus, MD  08/23/2017  8:03 AM

## 2017-08-24 ENCOUNTER — Encounter: Admit: 2017-08-24 | Discharge: 2017-08-24 | Payer: MEDICARE

## 2017-08-24 ENCOUNTER — Inpatient Hospital Stay: Admit: 2017-08-24 | Discharge: 2017-08-24 | Payer: MEDICARE

## 2017-08-24 DIAGNOSIS — C189 Malignant neoplasm of colon, unspecified: ICD-10-CM

## 2017-08-24 DIAGNOSIS — G2581 Restless legs syndrome: Principal | ICD-10-CM

## 2017-08-24 DIAGNOSIS — Z8619 Personal history of other infectious and parasitic diseases: ICD-10-CM

## 2017-08-24 DIAGNOSIS — H409 Unspecified glaucoma: ICD-10-CM

## 2017-08-24 LAB — C DIFFICILE BY PCR: Lab: POSITIVE

## 2017-08-24 LAB — BASIC METABOLIC PANEL: Lab: 138 MMOL/L — ABNORMAL LOW (ref >60)

## 2017-08-24 LAB — CBC AND DIFF: Lab: 6.2 K/UL — ABNORMAL LOW (ref 4.5–11.0)

## 2017-08-24 MED ORDER — LOPERAMIDE 2 MG PO CAP
2 mg | Freq: Once | ORAL | 0 refills | Status: DC
Start: 2017-08-24 — End: 2017-08-25

## 2017-08-24 MED ORDER — PROPOFOL 10 MG/ML IV EMUL 50 ML (INFUSION)(AM)(OR)
INTRAVENOUS | 0 refills | Status: DC
Start: 2017-08-24 — End: 2017-08-24
  Administered 2017-08-24: 22:00:00 80 ug/kg/min via INTRAVENOUS

## 2017-08-24 MED ORDER — LACTOBACILLUS RHAMNOSUS GG 15 BILLION CELL PO CPSP
1 | ORAL_CAPSULE | Freq: Two times a day (BID) | ORAL | 3 refills | Status: AC
Start: 2017-08-24 — End: ?

## 2017-08-24 MED ORDER — (INV) FMT LOWER DELIVERY 250ML RE ENEMA
250 mL | Freq: Once | RECTAL | 0 refills | Status: DC
Start: 2017-08-24 — End: 2017-08-25

## 2017-08-24 MED ORDER — LIDOCAINE (PF) 200 MG/10 ML (2 %) IJ SYRG
0 refills | Status: DC
Start: 2017-08-24 — End: 2017-08-24
  Administered 2017-08-24: 22:00:00 40 mg via INTRAVENOUS

## 2017-08-24 NOTE — Progress Notes
Colon/Lower EUS/Retrograde Enteroscopy     Post Lower Endoscopy Instructions    -If you feel feverish, have a temperature of 101 degrees or higher, persistent nausea and vomiting, abdominal pain or dark stools; please notify your nurse or GI physician.    -You may have abdominal cramping following the procedure this can be relieved by belching or passing air.    -If you have redness or swelling at the IV site, place a warm, wet washcloth over the affected areas for 15 minutes, 3-4 times a day until the redness subsides.  If symptoms continue for 2-3 days, contact your regular physician.    - If you have bleeding from your bowels over 2 tablespoons and increasing, please notify your physician.  A small amount of bleeding is normal if a biopsy or polyps were taken.    - You may resume all your routine medications, if medications need to be held your physician and/or nurse will notify you post procedure.    SPECIFIC INSTRUCTIONS  INPATIENTS:  Ask for help when you get up in your room, as you may still be drowsy from your sedation.    OUTPATIENTS:  A. Because of sedation and lack of coordination, UNTIL TOMORROW, DO NOT:  1. Operate any motorized vehicle - this includes driving.  2. Sign any legal documents or conduct important business matters.  3. Use any dangerous machinery (chain saw, lawnmower, etc.).  4. Drink any alcoholic beverages.  Should you have any questions or concerns after your procedure please call [phone] M-F 8am-5:00 pm. After 5:00 pm, holidays or weekends call [phone] and ask for the GI Doctor on call.

## 2017-08-24 NOTE — Anesthesia Pre-Procedure Evaluation
Anesthesia Pre-Procedure Evaluation    Name: Tara Diaz      MRN: [id number]     DOB: [birth year]     Age: 83 y.o.     Sex: female   __________________________________________________________________________     Procedure Date: 08/24/2017   Procedure: Procedure(s):  COLONOSCOPY  PREPARATION FECAL MICROBIOTA FOR INSTILLATION WITH ASSESSMENT DONOR SPECIMEN     Physical Assessment  Vital Signs (last filed in past 24 hours):  BP: 148/83 (11/08 1406)  Temp: 36.7 ???C (98.1 ???F) (11/08 1406)  Pulse: 72 (11/08 1406)  Respirations: 72 PER MINUTE (11/08 1406)  SpO2: 98 % (11/08 1406)  O2 Delivery: None (Room Air) (11/08 1406)      Patient History  Allergies   Allergen Reactions   ??? Pcn [Penicillins] UNKNOWN        Current Medications    Medication Directions   bimatoprost(+) (LUMIGAN) 0.03 % ophthalmic solution Apply 1 drop to right eye as directed at bedtime daily.   lactobacillus rhamnosus GG (CULTURELLE) 15 billion cell cpSP capsule Take one capsule by mouth as directed twice daily with meals.   pramipexole (MIRAPEX) 0.25 mg tablet Take 0.25 mg by mouth at bedtime daily. 2 to 3 hours before bed   vitamins, multiple cap Take 1 capsule by mouth daily.         Review of Systems/Medical History      Patient summary reviewed  Nursing notes reviewed  Pertinent labs reviewed    PONV Screening: Female gender and Non-smoker  No history of anesthetic complications  No family history of anesthetic complications      Airway - negative        Pulmonary             Sleep apnea          Interventions: CPAP; compliant      Cardiovascular       Recent diagnostic studies:          echocardiogram            Technically difficult study due to poor patient cooperation  Mild concentric LVH  Mild LV diastolic dysfunction  Hyperdynamic LV systolic function, EF 70%  ECHO 06/24/2017:        Exercise tolerance: <4 METS      Beta Blocker therapy: No      Beta blockers within 24 hours: n/a      Hypertension,       GI/Hepatic/Renal Inflammatory bowel disease        GERD, well controlled      REASON FOR ADMISSION/PROCEDURE  Diarrhea  Abdominal pain (cramping)  Chronic C. Diff. Infections  Hx of colon cancer s/p colectomy   Septic      Neuro/Psych - negative        Musculoskeletal - negative        Endocrine/Other         Malignancy      Hx of colon cancer  Leukocytosis  Restless leg syndrome   Physical Exam    Airway Findings      Mallampati: II      TM distance: >3 FB      Neck ROM: full      Mouth opening: good    Dental Findings:             Cardiovascular Findings: Negative      Rhythm: regular      Rate: normal    Pulmonary Findings: Negative      Breath  sounds clear to auscultation.    Neurological Findings: Negative         Diagnostic Tests  Hematology:   Lab Results   Component Value Date    HGB 13.8 08/24/2017    HCT 41.9 08/24/2017    PLTCT 268 08/24/2017    WBC 6.2 08/24/2017    NEUT 57 08/24/2017    ANC 3.60 08/24/2017    ALC 1.70 08/24/2017    MONA 12 08/24/2017    AMC 0.80 08/24/2017    EOSA 3 08/24/2017    ABC 0.00 08/24/2017    MCV 98.1 08/24/2017    MCH 32.3 08/24/2017    MCHC 33.0 08/24/2017    MPV 7.9 08/24/2017    RDW 13.9 08/24/2017         General Chemistry:   Lab Results   Component Value Date    NA 138 08/24/2017    K 3.6 08/24/2017    CL 108 08/24/2017    CO2 22 08/24/2017    GAP 8 08/24/2017    BUN 9 08/24/2017    CR 0.70 08/24/2017    GLU 78 08/24/2017    CA 8.7 08/24/2017    ALBUMIN 4.5 06/23/2017    LACTIC 2.0 06/24/2017    TOTBILI 0.6 06/23/2017      Coagulation:   Lab Results   Component Value Date    PTT 26.3 06/24/2017    INR 1.2 06/24/2017         Anesthesia Plan    ASA score: 3   Plan: MAC  Induction method: intravenous  NPO status: acceptable      Informed Consent  Anesthetic plan and risks discussed with patient.  Use of blood products discussed with patient  Blood Consent: consented      Plan discussed with: SRNA.  Comments: (Questions answered. Pt understands possibility of GA.  Risks of GA with LMA/ETT explained including sore throat, oral/dental injury, allergic reactions, PONV, aspiration, respiratory failure, MI, CVA discussed with patient who reports understanding and consents to anesthetic plan.)

## 2017-08-24 NOTE — H&P (View-Only)
Pre Procedure History and Physical/Sedation Plan    Name:Tara Diaz                                                                   MRN: [id number]                 DOB:1930-04-25          Age: 83 y.o.  Admission Date: 08/22/2017             Days Admitted: LOS: 2 days      Procedure Date:  08/24/2017    Planned Procedure(s):  GI:  Colonoscopy  Sedation/Medication Plan:  MAC (Monitored Anesthesia Care)  Discussion/Reviews:  Physician has discussed risks and alternatives of this type of sedation and above planned procedures with patient  ________________________________________________________________  Chief Complaint:  Inpatient endo consult note reviewed.      Previous Anesthetic/Sedation History:  reviewed.     Allergies:  Pcn [penicillins]  Medications:  Scheduled Meds:  [MAR Hold] (INV) FMT lower delivery enema 250 mL 250 mL Rectal ONCE   [MAR Hold] electrolyte GUT PEG (NULYTELY, COLYTE, GAVILYTE-N) oral solution 4 L 4 L Oral As Prescribed   [MAR Hold] enoxaparin (LOVENOX) syringe 40 mg 40 mg Subcutaneous QDAY(21)   [MAR Hold] lactobacillus rhamnosus GG (CULTURELLE) 15 billion cell capsule 1 capsule 1 capsule Oral BID w/meals   [MAR Hold] latanoprost (XALATAN) 0.005 % ophthalmic solution 1 drop 1 drop Right Eye QHS   [MAR Hold] pramipexole (MIRAPEX) tablet 0.25 mg 0.25 mg Oral QHS   Continuous Infusions:  ??? sodium chloride 0.9 %   infusion 75 mL/hr at 08/24/17 1222     PRN and Respiratory Meds:[MAR Hold] acetaminophen Q6H PRN, [MAR Hold] electrolyte GUT PEG PRN (On Call from Rx), [MAR Hold] ondansetron (ZOFRAN) IV Q6H PRN       Vital Signs:  Last Filed Vital Signs: 24 Hour Range   BP: 148/83 (11/08 1406)  Temp: 36.7 ???C (98.1 ???F) (11/08 1406)  Pulse: 72 (11/08 1406)  Respirations: 72 PER MINUTE (11/08 1406)  SpO2: 98 % (11/08 1406)  O2 Delivery: None (Room Air) (11/08 1406) BP: (121-149)/(48-83)   Temp:  [36.4 ???C (97.5 ???F)-36.8 ???C (98.2 ???F)]   Pulse:  [65-82]   Respirations:  [16 PER MINUTE-72 PER MINUTE] SpO2:  [97 %-99 %]   O2 Delivery: None (Room Air)     NPO Status:     Airway:  airway assessment performed  Mallampati II (soft palate, uvula, fauces visible)  Anesthesia Classification:  ASA III (A patient with a severe systemic disease that limits activity, but is not incapacitating)  NPO Status: Acceptable  Preganancy Status: Not Pregnant    Lab/Radiology/Other Diagnostic Tests  Labs:    24-hour labs:    Results for orders placed or performed during the hospital encounter of 08/22/17 (from the past 24 hour(s))   CBC AND DIFF    Collection Time: 08/24/17  5:32 AM   Result Value Ref Range    White Blood Cells 6.2 4.5 - 11.0 K/UL    RBC 4.27 4.0 - 5.0 M/UL    Hemoglobin 13.8 12.0 - 15.0 GM/DL    Hematocrit 91.4 36 - 45 %    MCV 98.1 80 - 100 FL  MCH 32.3 26 - 34 PG    MCHC 33.0 32.0 - 36.0 G/DL    RDW 27.2 11 - 15 %    Platelet Count 268 150 - 400 K/UL    MPV 7.9 7 - 11 FL    Neutrophils 57 41 - 77 %    Lymphocytes 27 24 - 44 %    Monocytes 12 4 - 12 %    Eosinophils 3 0 - 5 %    Basophils 1 0 - 2 %    Absolute Neutrophil Count 3.60 1.8 - 7.0 K/UL    Absolute Lymph Count 1.70 1.0 - 4.8 K/UL    Absolute Monocyte Count 0.80 0 - 0.80 K/UL    Absolute Eosinophil Count 0.20 0 - 0.45 K/UL    Absolute Basophil Count 0.00 0 - 0.20 K/UL   BASIC METABOLIC PANEL    Collection Time: 08/24/17  5:32 AM   Result Value Ref Range    Sodium 138 137 - 147 MMOL/L    Potassium 3.6 3.5 - 5.1 MMOL/L    Chloride 108 98 - 110 MMOL/L    CO2 22 21 - 30 MMOL/L    Anion Gap 8 3 - 12    Glucose 78 70 - 100 MG/DL    Blood Urea Nitrogen 9 7 - 25 MG/DL    Creatinine 5.36 0.4 - 1.00 MG/DL    Calcium 8.7 8.5 - 64.4 MG/DL    eGFR Non African American >60 >60 mL/min    eGFR African American >60 >60 mL/min       Anette Guarneri, MD  Pager

## 2017-08-24 NOTE — Discharge Instructions - Appointments
Follow-up with primary care doctor in 1-2 weeks

## 2017-08-24 NOTE — Telephone Encounter
Hi Lori,    Please make sure pt has follow up appointment in the next 2 months with me.    Thank you.  Mellody Dance

## 2017-08-24 NOTE — Progress Notes
I have reviewed the notes, assessment, and/or procedures performed by Lianne Moris, RN and concur with her/his documentation unless otherwise noted.

## 2017-08-24 NOTE — Anesthesia Post-Procedure Evaluation
Post-Anesthesia Evaluation    Name: Tara Diaz      MRN: [id number]     DOB: 1929-12-22     Age: 83 y.o.     Sex: female   __________________________________________________________________________     Procedure Date: 08/24/2017  Procedure: Procedure(s):  COLONOSCOPY  PREPARATION FECAL MICROBIOTA FOR INSTILLATION WITH ASSESSMENT DONOR SPECIMEN  COLONOSCOPY BIOPSY      Surgeon: Surgeon(s):  Carilyn Goodpasture, MD    Post-Anesthesia Vitals  BP: 111/79 (11/08 1636)  Temp: 36.9 C (98.4 F) (11/08 1636)  Pulse: 85 (11/08 1636)  SpO2: 93 % (11/08 1636)  O2 Delivery: None (Room Air) (11/08 1636)      Post Anesthesia Evaluation Note    Evaluation location: Pre/Post  Patient participation: recovered; patient participated in evaluation  Level of consciousness: alert    Pain score: 0  Pain management: adequate    Hydration: normovolemia  Temperature: 36.0C - 38.4C  Airway patency: adequate    Perioperative Events  Perioperative events:  no       Post-op nausea and vomiting: no PONV    Postoperative Status  Cardiovascular status: hemodynamically stable  Respiratory status: spontaneous ventilation  Follow-up needed: none        Perioperative Events  Perioperative Event: No  Emergency Case Activation: No

## 2017-08-25 DIAGNOSIS — Z85038 Personal history of other malignant neoplasm of large intestine: ICD-10-CM

## 2017-08-25 DIAGNOSIS — Z96612 Presence of left artificial shoulder joint: ICD-10-CM

## 2017-08-25 DIAGNOSIS — H409 Unspecified glaucoma: ICD-10-CM

## 2017-08-25 DIAGNOSIS — R5383 Other fatigue: ICD-10-CM

## 2017-08-25 DIAGNOSIS — A0471 Enterocolitis due to Clostridium difficile, recurrent: Principal | ICD-10-CM

## 2017-08-25 DIAGNOSIS — G2581 Restless legs syndrome: ICD-10-CM

## 2017-08-25 NOTE — Discharge Instructions - Pharmacy
Physician Discharge Summary  [facility]      Name: Tara Diaz                                             Location:[facility]          Medical Record Number: [id number]                Account Number:  [id number]  Date Of Birth:  [birth year]                                  Age:  83 years   Admit date:  08/22/2017                              Discharge date: August 24, 2017    Service: Med Private E- [phone]  LOS: 2 days    Physician Summary completed by: Al Corpus, MD    Reason for hospitalization: Recurrent C. difficile      Brief Hospital Course: 83 year old female patient with history of colon cancer status post colectomy, restless leg syndrome, glaucoma, recurrent C. difficile infection admitted the hospital for consideration of fecal transplant.  Patient had a workup such as a repeat C. difficile which is positive, and also she had a colonoscopy on 08/24/2017 along with fecal transplant by gastroenterology.  Postprocedure she was stable and gastroenterology recommended to discharge home.  Patient has family support, she wanted to go home today, family is willing to stay with her today and tomorrow.      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 difficile  Sepsis ruled out, less likely  Glaucoma stable  Restless leg syndrome    Discharge Disposition: Home    Discharge Condition: Stable    ALLERGIES:Pcn [penicillins]    SIGNIFICANT PMH:   Past Medical History:   Diagnosis Date   ??? Colon cancer (HCC)    ??? Glaucoma    ??? History of Clostridium difficile infection    ??? Restless leg syndrome        Patient instructions/medications:     Activity as Tolerated   It is important to keep increasing your activity level after you leave the hospital.  Moving around can help prevent blood clots, lung infection (pneumonia) and other problems.  Gradually increasing the number of times you are up moving around will help you return to your normal activity level more quickly.  Continue to increase the number of times you are up to the chair and walking daily to return to your normal activity level. Begin to work toward your normal activity level   Do not drive today     Report These Signs and Symptoms   Please contact your doctor if you have any of the following symptoms: temperature higher than 100 degrees F     Questions About Your Stay   For questions or concerns regarding your hospital stay. Call [phone]   Discharging attending physician: Domenic Schwab [[id number]]      Regular Diet   You have no dietary restriction. Please continue with a healthy balanced diet.        Current Discharge Medication  List       CONTINUE these medications which have been CHANGED or REFILLED    Details   lactobacillus rhamnosus GG (CULTURELLE) 15 billion cell cpSP capsule Take one capsule by mouth as directed twice daily with meals.  Qty: 90 capsule, Refills: 3    PRESCRIPTION TYPE:  Normal          CONTINUE these medications which have NOT CHANGED    Details   bimatoprost(+) (LUMIGAN) 0.03 % ophthalmic solution Apply 1 drop to right eye as directed at bedtime daily.    PRESCRIPTION TYPE:  Historical Med      pramipexole (MIRAPEX) 0.25 mg tablet Take 0.25 mg by mouth at bedtime daily. 2 to 3 hours before bed    PRESCRIPTION TYPE:  Historical Med      vitamins, multiple cap Take 1 capsule by mouth daily.    PRESCRIPTION TYPE:  Historical Med          The following medications were removed from your list. This list includes medications discontinued this stay and those removed from your prior med list in our system        ergocalciferol (vitamin D2) (VITAMIN D PO)        ibuprofen (ADVIL) 200 mg tablet        vancomycin (FIRVANQ) 25 mg/mL oral solution        vitamin E 100 unit capsule               Pending items needing follow up:: Biopsy results from colonoscopy, she needs to follow-up with the gastroenterology for repeat colonoscopy in 3 months.    Signed:  Al Corpus, MD  08/25/2017      cc:  Primary Care Physician:  Fredia Sorrow A     Referring physicians:

## 2017-08-25 NOTE — Progress Notes
Tara Diaz discharged on 08/24/2017.   Marland Kitchen  Discharge instructions reviewed with patient and family.   Valuables returned:   Personal Items / Valuables: Valuables/Belongings home with patient  Hearing Aid Type: Both  Where Are Valuables Stored?: with pt.   .  Functional assessment at discharge complete: Yes .

## 2017-08-28 ENCOUNTER — Encounter: Admit: 2017-08-28 | Discharge: 2017-08-28 | Payer: MEDICARE

## 2017-08-28 DIAGNOSIS — Z8619 Personal history of other infectious and parasitic diseases: ICD-10-CM

## 2017-08-28 DIAGNOSIS — G2581 Restless legs syndrome: Principal | ICD-10-CM

## 2017-08-28 DIAGNOSIS — H409 Unspecified glaucoma: ICD-10-CM

## 2017-08-28 DIAGNOSIS — C189 Malignant neoplasm of colon, unspecified: ICD-10-CM

## 2017-09-06 ENCOUNTER — Encounter: Admit: 2017-09-06 | Discharge: 2017-09-06 | Payer: MEDICARE

## 2017-10-11 ENCOUNTER — Ambulatory Visit: Admit: 2017-10-11 | Discharge: 2017-10-12 | Payer: MEDICARE

## 2017-10-11 ENCOUNTER — Encounter: Admit: 2017-10-11 | Discharge: 2017-10-11 | Payer: MEDICARE

## 2017-10-11 DIAGNOSIS — Z8619 Personal history of other infectious and parasitic diseases: ICD-10-CM

## 2017-10-11 DIAGNOSIS — G2581 Restless legs syndrome: Principal | ICD-10-CM

## 2017-10-11 DIAGNOSIS — C189 Malignant neoplasm of colon, unspecified: ICD-10-CM

## 2017-10-11 DIAGNOSIS — A0471 Enterocolitis due to Clostridium difficile, recurrent: Principal | ICD-10-CM

## 2017-10-11 DIAGNOSIS — H409 Unspecified glaucoma: ICD-10-CM

## 2017-11-06 ENCOUNTER — Encounter: Admit: 2017-11-06 | Discharge: 2017-11-07 | Payer: MEDICARE

## 2017-11-06 DIAGNOSIS — R69 Illness, unspecified: Principal | ICD-10-CM

## 2017-11-22 ENCOUNTER — Encounter: Admit: 2017-11-22 | Discharge: 2017-11-22 | Payer: MEDICARE

## 2017-11-22 DIAGNOSIS — R69 Illness, unspecified: Principal | ICD-10-CM

## 2017-12-08 ENCOUNTER — Encounter: Admit: 2017-12-08 | Discharge: 2017-12-08 | Payer: MEDICARE

## 2017-12-08 DIAGNOSIS — S22000A Wedge compression fracture of unspecified thoracic vertebra, initial encounter for closed fracture: Principal | ICD-10-CM

## 2017-12-15 ENCOUNTER — Ambulatory Visit: Admit: 2017-12-15 | Discharge: 2017-12-15 | Payer: MEDICARE

## 2017-12-15 DIAGNOSIS — M4854XA Collapsed vertebra, not elsewhere classified, thoracic region, initial encounter for fracture: Principal | ICD-10-CM

## 2017-12-15 DIAGNOSIS — Z880 Allergy status to penicillin: ICD-10-CM

## 2017-12-15 DIAGNOSIS — A0471 Enterocolitis due to Clostridium difficile, recurrent: ICD-10-CM

## 2017-12-15 DIAGNOSIS — C189 Malignant neoplasm of colon, unspecified: ICD-10-CM

## 2017-12-15 MED ORDER — FENTANYL CITRATE (PF) 50 MCG/ML IJ SOLN
0 refills | Status: DC
Start: 2017-12-15 — End: 2017-12-15
  Administered 2017-12-15: 17:00:00 25 ug via INTRAVENOUS

## 2017-12-15 MED ORDER — LIDOCAINE (PF) 200 MG/10 ML (2 %) IJ SYRG
0 refills | Status: DC
Start: 2017-12-15 — End: 2017-12-15
  Administered 2017-12-15: 17:00:00 40 mg via INTRAVENOUS

## 2017-12-15 MED ORDER — LACTATED RINGERS IV SOLP
0 refills | Status: DC
Start: 2017-12-15 — End: 2017-12-15
  Administered 2017-12-15: 17:00:00 via INTRAVENOUS

## 2017-12-15 MED ORDER — HYDROMORPHONE (PF) 2 MG/ML IJ SYRG
.3 mg | INTRAVENOUS | 0 refills | Status: CN | PRN
Start: 2017-12-15 — End: ?

## 2017-12-15 MED ORDER — PROMETHAZINE 25 MG/ML IJ SOLN
6.25 mg | INTRAVENOUS | 0 refills | Status: CN | PRN
Start: 2017-12-15 — End: ?

## 2017-12-15 MED ORDER — HALOPERIDOL LACTATE 5 MG/ML IJ SOLN
.5 mg | Freq: Once | INTRAVENOUS | 0 refills | Status: CN | PRN
Start: 2017-12-15 — End: ?

## 2017-12-15 MED ORDER — PROPOFOL 10 MG/ML IV EMUL 50 ML (INFUSION)(AM)(OR)
INTRAVENOUS | 0 refills | Status: DC
Start: 2017-12-15 — End: 2017-12-15
  Administered 2017-12-15: 17:00:00 50 ug/kg/min via INTRAVENOUS

## 2017-12-15 MED ORDER — FENTANYL CITRATE (PF) 50 MCG/ML IJ SOLN
25 ug | INTRAVENOUS | 0 refills | Status: CN | PRN
Start: 2017-12-15 — End: ?

## 2017-12-15 MED ORDER — DIPHENHYDRAMINE HCL 50 MG/ML IJ SOLN
12.5 mg | Freq: Once | INTRAVENOUS | 0 refills | Status: CN | PRN
Start: 2017-12-15 — End: ?

## 2017-12-15 MED ORDER — CEFAZOLIN 1 GRAM IJ SOLR
0 refills | Status: DC
Start: 2017-12-15 — End: 2017-12-15
  Administered 2017-12-15: 17:00:00 2 g via INTRAVENOUS

## 2018-01-18 ENCOUNTER — Encounter: Admit: 2018-01-18 | Discharge: 2018-01-18 | Payer: MEDICARE

## 2018-01-18 DIAGNOSIS — H409 Unspecified glaucoma: ICD-10-CM

## 2018-01-18 DIAGNOSIS — G2581 Restless legs syndrome: Principal | ICD-10-CM

## 2018-01-18 DIAGNOSIS — C189 Malignant neoplasm of colon, unspecified: ICD-10-CM

## 2018-01-18 DIAGNOSIS — Z8619 Personal history of other infectious and parasitic diseases: ICD-10-CM

## 2018-01-26 ENCOUNTER — Encounter: Admit: 2018-01-26 | Discharge: 2018-01-26 | Payer: MEDICARE

## 2018-11-03 IMAGING — CR CHEST
2 series · 2 of 2 positions shown · non-contrast
Comparison: none

[chest ap grid]
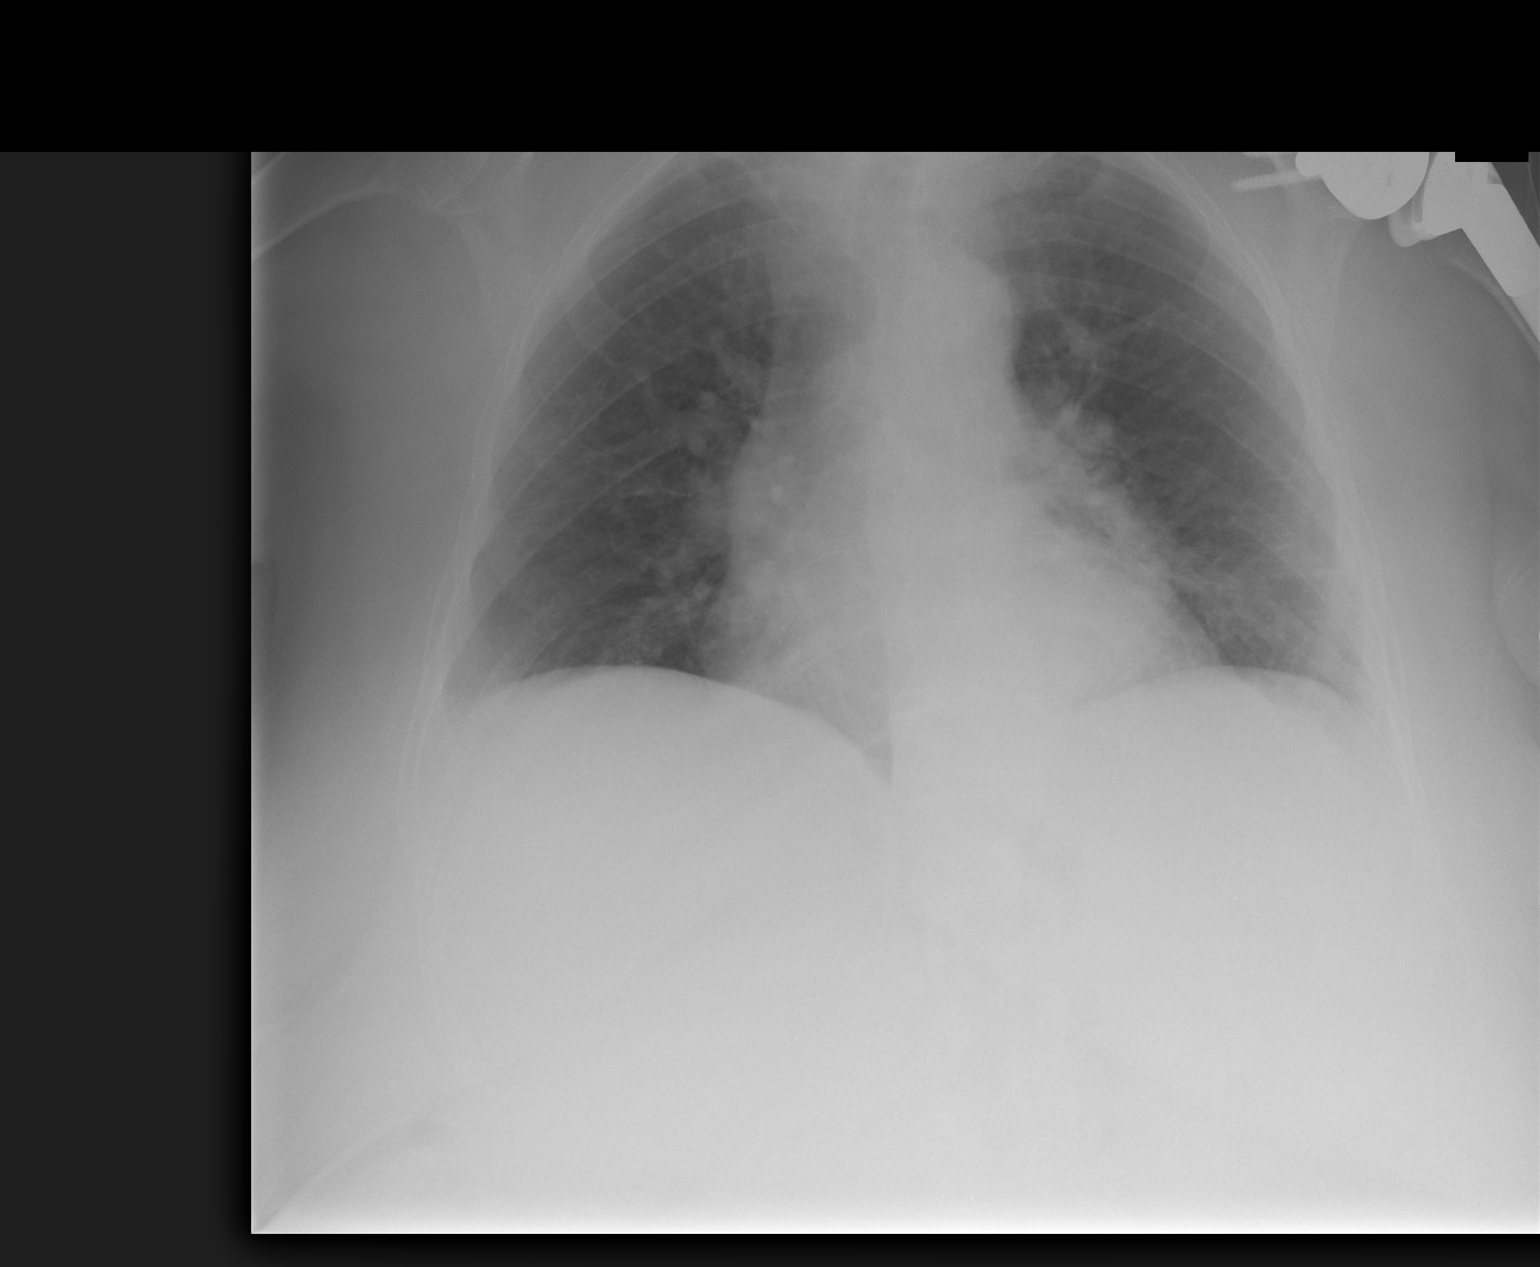

[chest lat]
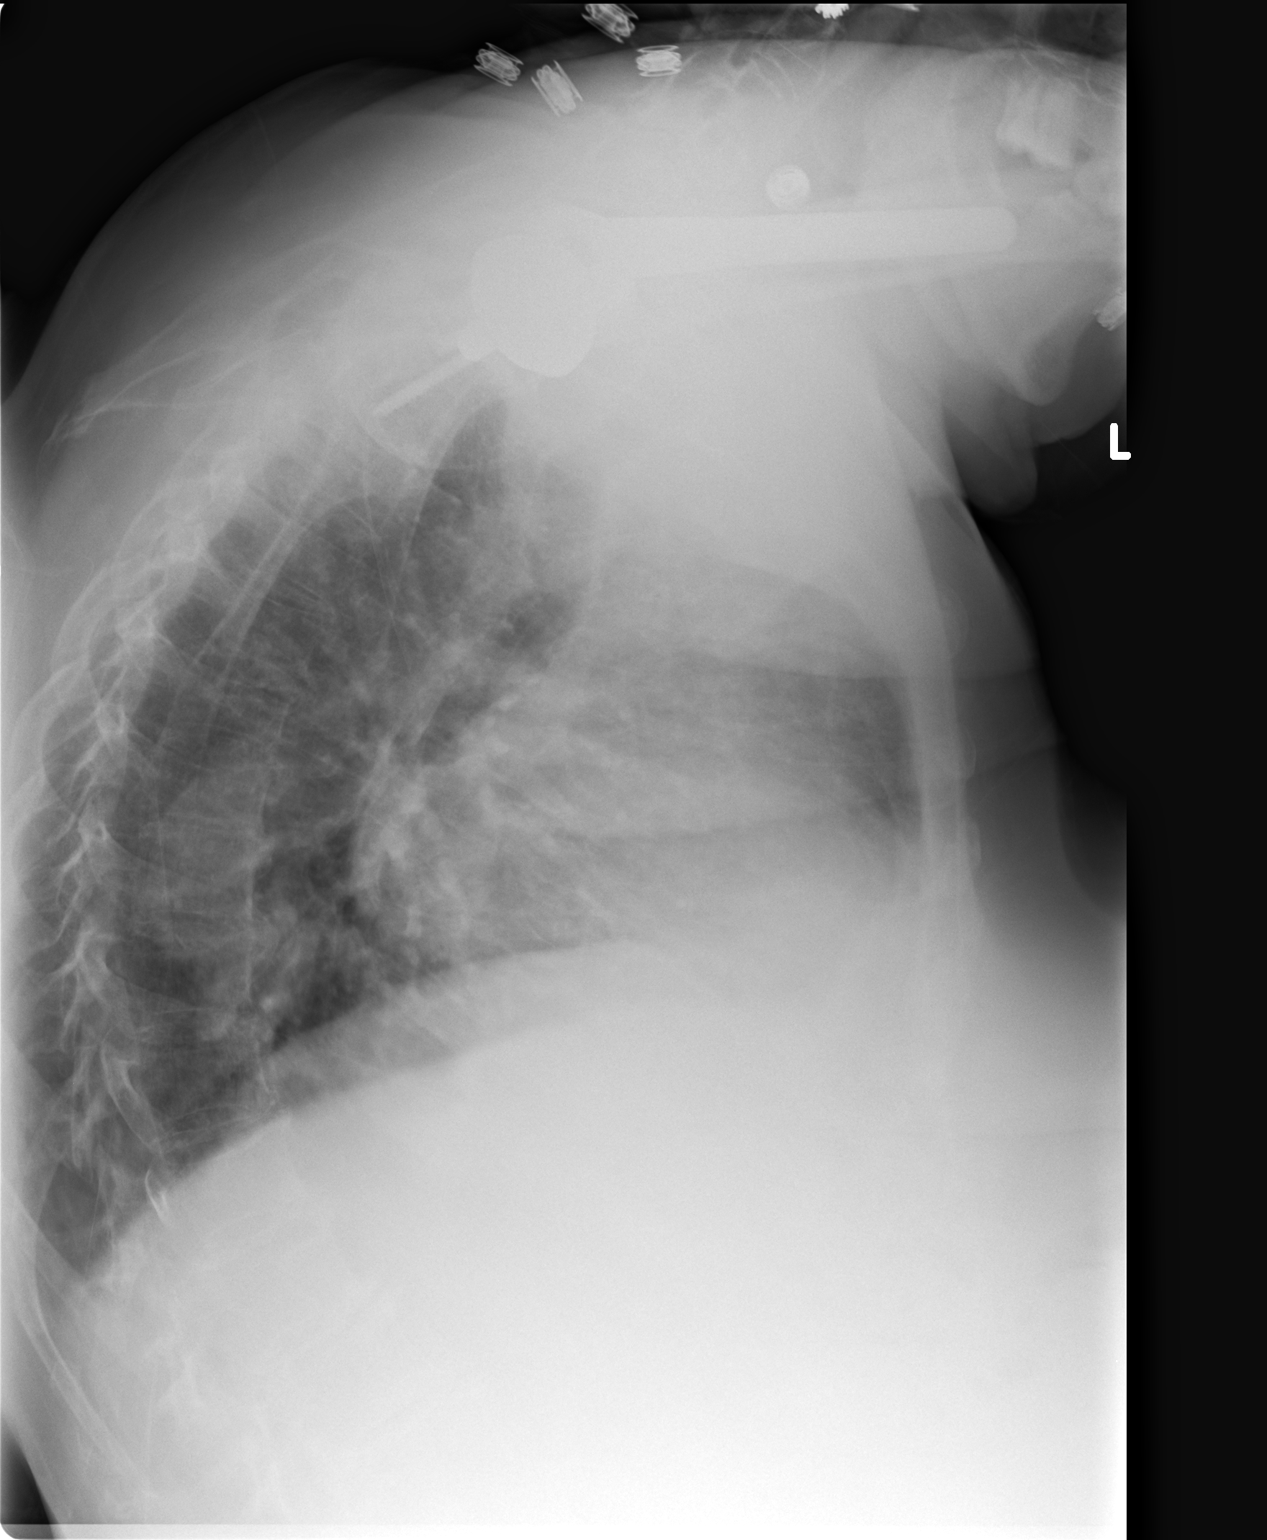

[2 of 2 positions shown; findings below may reference images not displayed]

DIAGNOSTIC STUDIES

EXAM

RADIOLOGICAL EXAMINATION, CHEST; 2 VIEWS FRONTAL AND LATERAL CPT 70959

INDICATION

fever, elevated WBC's, weakness
fever, elevated WBC's, weakness

TECHNIQUE

Frontal and lateral views of the chest were performed.

COMPARISONS

CT 11/20/2015.

FINDINGS

The heart appears normal in size. There is mild bilateral hilar prominence. No dense
consolidation. Left shoulder arthroplasty.

IMPRESSION

No acute consolidation, congestion, or pleural effusions.

## 2023-05-13 IMAGING — US ABDCM
1 series · 14 of 25 positions shown · non-contrast
Comparison: none

[Series 1: us abdomen complete · 14 of 83 slices shown]
[im 1/83]
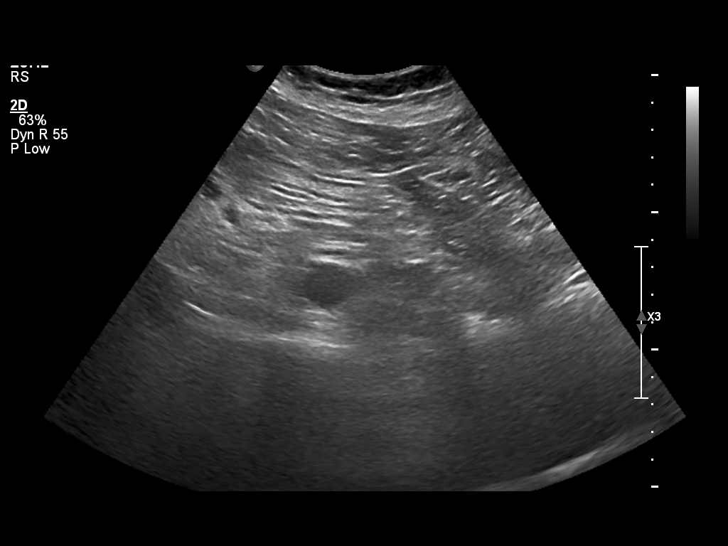
[im 7/83]
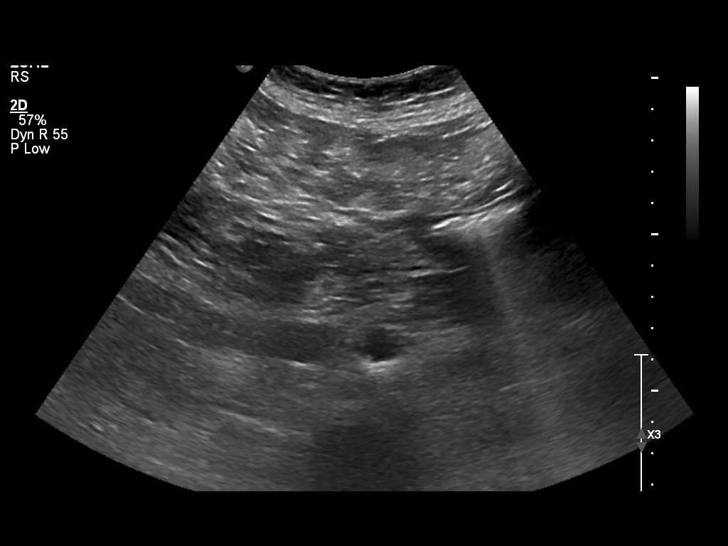
[im 14/83]
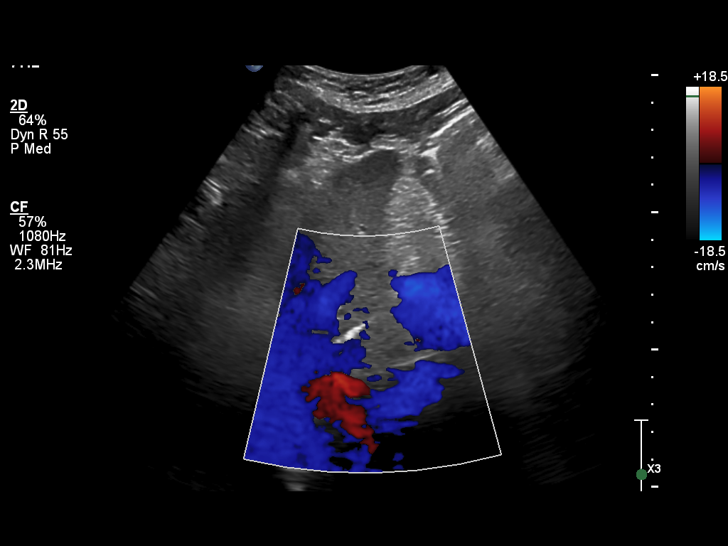
[im 21/83]
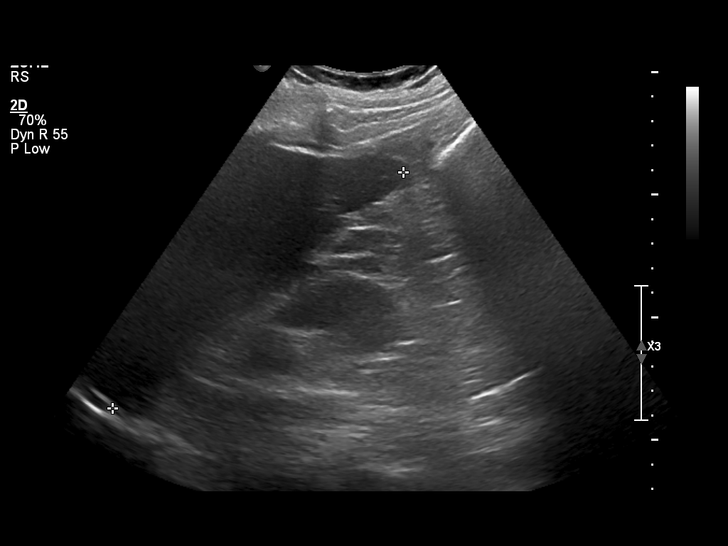
[im 28/83]
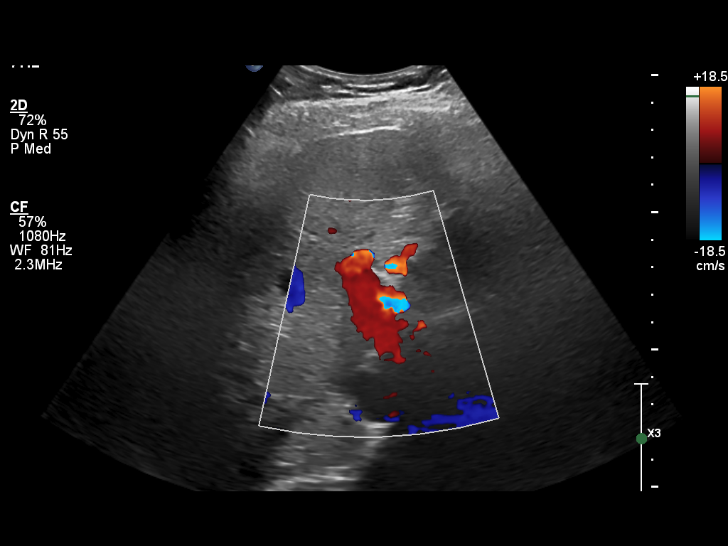
[im 31/83]
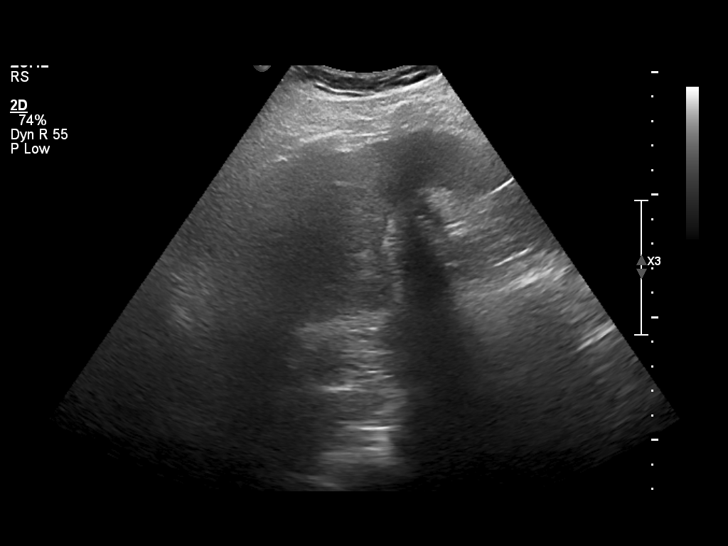
[im 38/83]
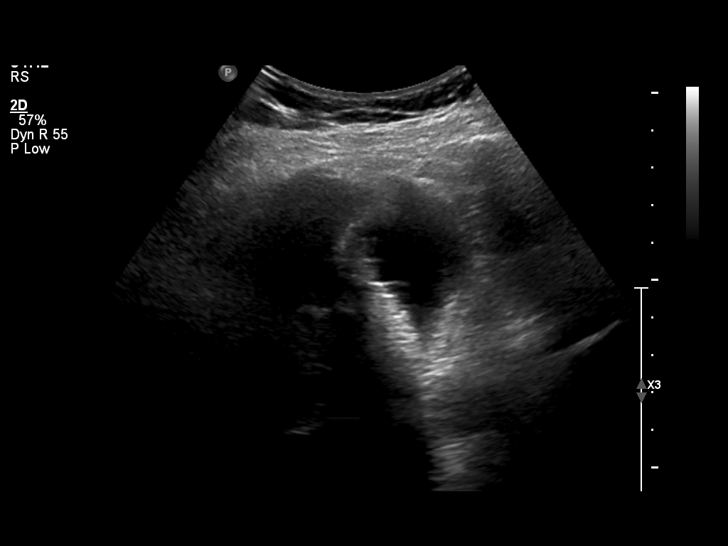
[im 45/83]
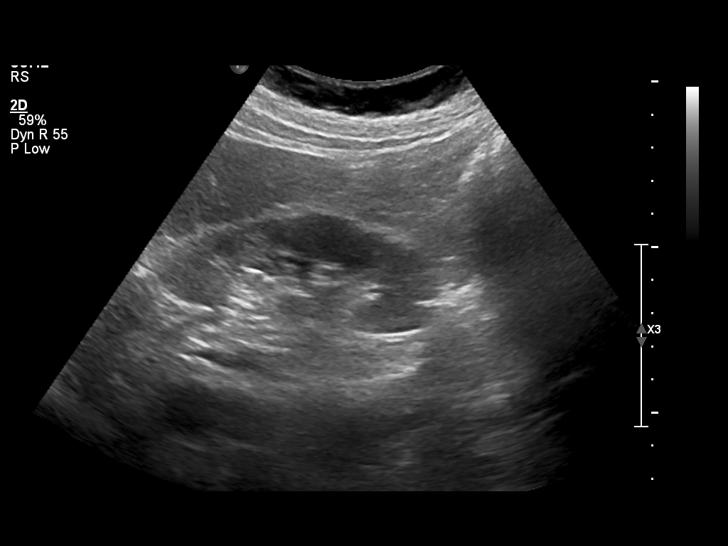
[im 52/83]
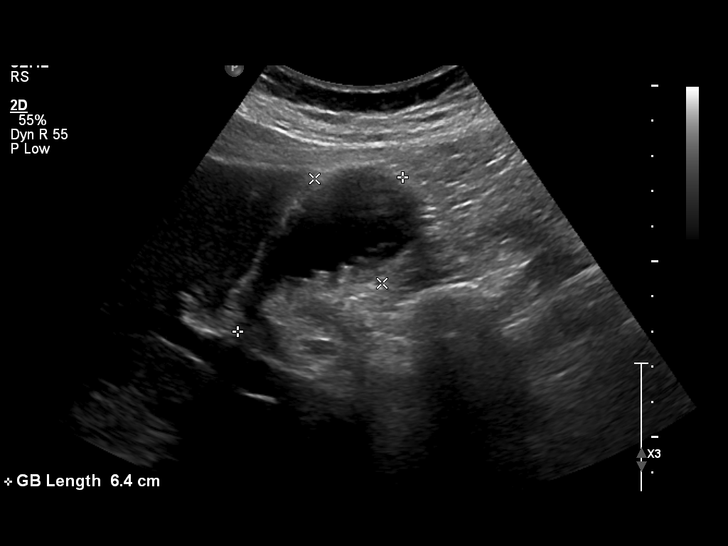
[im 55/83]
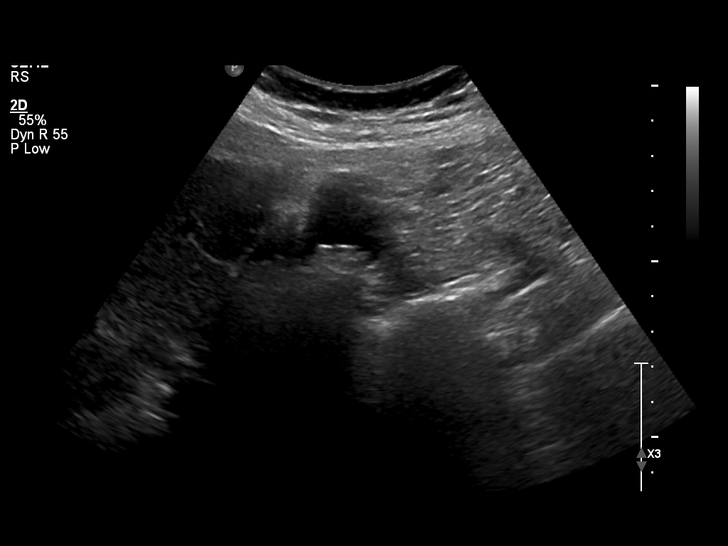
[im 62/83]
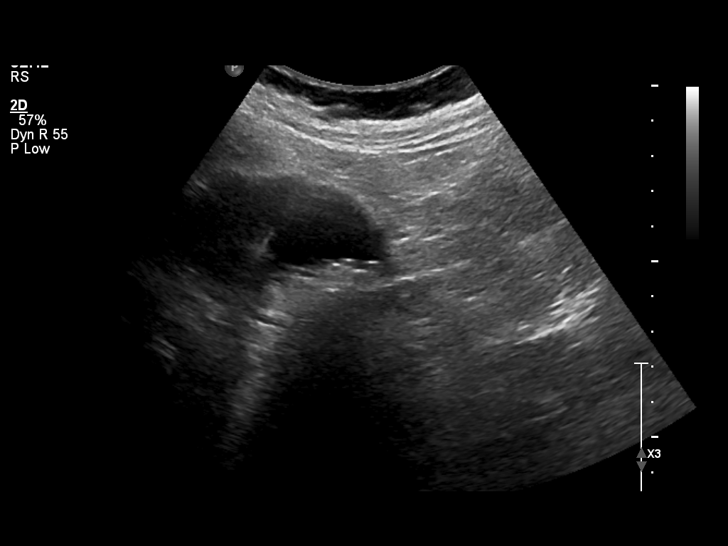
[im 69/83]
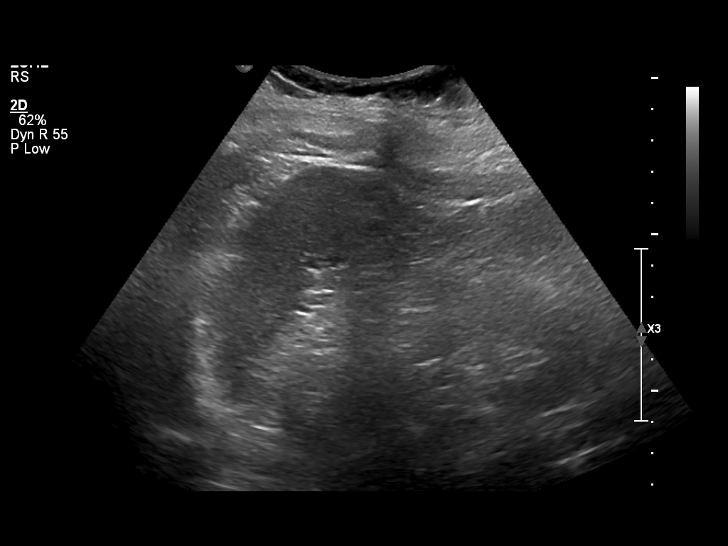
[im 76/83]
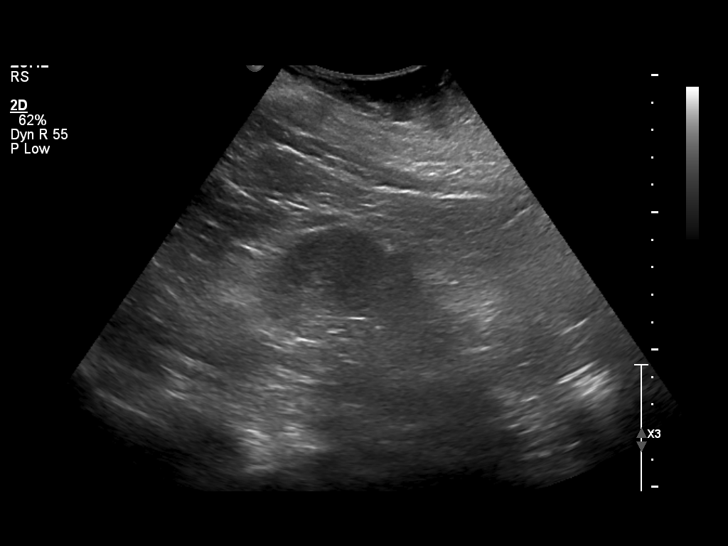
[im 83/83]
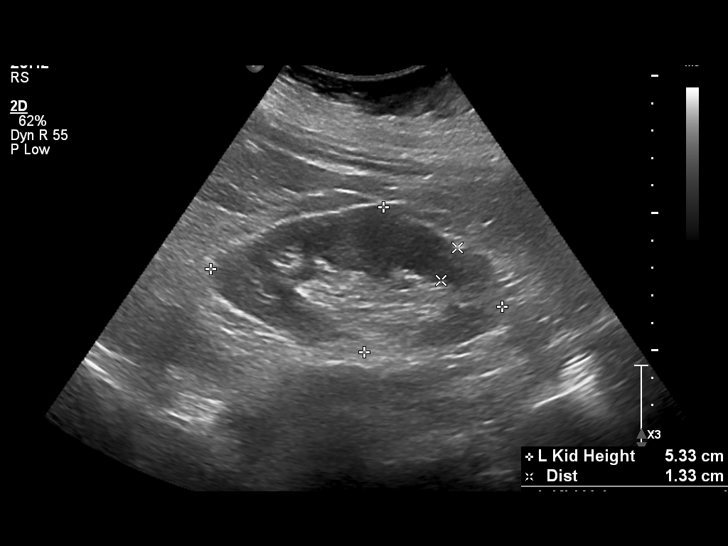

[14 of 25 positions shown; findings below may reference images not displayed]

Blom, Deisi  * Location:

EXAM

US ABDOMEN COMPLETE

INDICATION

Episodic abdominal pain
epigastric pain

COMPARISONS

None available at the time of dictation.

FINDINGS

The liver measures 15.3  cm, normal . There is   echogenicity and there is no suspicious focal
lesion.

The common bile duct measures 0.3  cm. The gallbladder wall measures 0.2  cm. Mobile cholelithiasis
with multiple gallstones. Biliary sludge. Positive sonographic Murphy's sign.

Limited evaluation of the pancreas secondary to overlying bowel gas and body habitus.

The right kidney measures 9.4  x 3.7  x 4  cm, Normal echogenicity.

The left kidney measures 10.7  x 5.3  x 4.3  cm.  Normal echogenicity

The maximal AP and transverse dimensions of the abdominal aorta measure 1.7  x 1.7  cm, within
normal limits. No aneurysmal dilatation of the entire aorta.

IMPRESSION
1. Cholelithiasis with biliary sludge and positive sonographic Murphy's sign. Imaging findings are
equivocal for acute cholecystitis given lack of discrete visualization of stone lodged at the
gallbladder neck.

Tech Notes:

epigastric pain
